# Patient Record
Sex: Male | Born: 1935
Health system: Southern US, Community
[De-identification: ages and names within clinical notes are randomized; demographics above are authoritative.]

## PROBLEM LIST (undated history)

## (undated) DIAGNOSIS — E538 Deficiency of other specified B group vitamins: Secondary | ICD-10-CM

## (undated) DIAGNOSIS — R001 Bradycardia, unspecified: Principal | ICD-10-CM

## (undated) DIAGNOSIS — N400 Enlarged prostate without lower urinary tract symptoms: Secondary | ICD-10-CM

## (undated) DIAGNOSIS — I10 Essential (primary) hypertension: Secondary | ICD-10-CM

## (undated) DIAGNOSIS — E119 Type 2 diabetes mellitus without complications: Secondary | ICD-10-CM

## (undated) DIAGNOSIS — N529 Male erectile dysfunction, unspecified: Secondary | ICD-10-CM

## (undated) HISTORY — DX: Type 2 diabetes mellitus without complications: E11.9

## (undated) HISTORY — PX: HERNIA REPAIR: SHX51

## (undated) HISTORY — DX: Deficiency of other specified B group vitamins: E53.8

## (undated) HISTORY — DX: Male erectile dysfunction, unspecified: N52.9

## (undated) HISTORY — DX: Benign prostatic hyperplasia without lower urinary tract symptoms: N40.0

## (undated) HISTORY — DX: Bradycardia, unspecified: R00.1

## (undated) HISTORY — DX: Essential (primary) hypertension: I10

---

## 2011-07-02 DIAGNOSIS — E785 Hyperlipidemia, unspecified: Secondary | ICD-10-CM | POA: Diagnosis not present

## 2011-07-02 DIAGNOSIS — N400 Enlarged prostate without lower urinary tract symptoms: Secondary | ICD-10-CM | POA: Diagnosis not present

## 2011-07-02 DIAGNOSIS — Z1331 Encounter for screening for depression: Secondary | ICD-10-CM | POA: Diagnosis not present

## 2011-07-02 DIAGNOSIS — I10 Essential (primary) hypertension: Secondary | ICD-10-CM | POA: Diagnosis not present

## 2011-07-02 DIAGNOSIS — R1310 Dysphagia, unspecified: Secondary | ICD-10-CM | POA: Diagnosis not present

## 2011-07-02 DIAGNOSIS — E119 Type 2 diabetes mellitus without complications: Secondary | ICD-10-CM | POA: Diagnosis not present

## 2011-08-28 DIAGNOSIS — D2310 Other benign neoplasm of skin of unspecified eyelid, including canthus: Secondary | ICD-10-CM | POA: Diagnosis not present

## 2011-08-28 DIAGNOSIS — E1139 Type 2 diabetes mellitus with other diabetic ophthalmic complication: Secondary | ICD-10-CM | POA: Diagnosis not present

## 2011-12-31 DIAGNOSIS — K219 Gastro-esophageal reflux disease without esophagitis: Secondary | ICD-10-CM | POA: Diagnosis not present

## 2011-12-31 DIAGNOSIS — W19XXXA Unspecified fall, initial encounter: Secondary | ICD-10-CM | POA: Diagnosis not present

## 2011-12-31 DIAGNOSIS — I10 Essential (primary) hypertension: Secondary | ICD-10-CM | POA: Diagnosis not present

## 2011-12-31 DIAGNOSIS — N529 Male erectile dysfunction, unspecified: Secondary | ICD-10-CM | POA: Diagnosis not present

## 2011-12-31 DIAGNOSIS — E119 Type 2 diabetes mellitus without complications: Secondary | ICD-10-CM | POA: Diagnosis not present

## 2012-03-30 DIAGNOSIS — H3530 Unspecified macular degeneration: Secondary | ICD-10-CM | POA: Diagnosis not present

## 2012-04-09 DIAGNOSIS — H2510 Age-related nuclear cataract, unspecified eye: Secondary | ICD-10-CM | POA: Diagnosis not present

## 2012-04-09 DIAGNOSIS — H5000 Unspecified esotropia: Secondary | ICD-10-CM | POA: Diagnosis not present

## 2012-04-09 DIAGNOSIS — H04129 Dry eye syndrome of unspecified lacrimal gland: Secondary | ICD-10-CM | POA: Diagnosis not present

## 2012-04-10 DIAGNOSIS — Z23 Encounter for immunization: Secondary | ICD-10-CM | POA: Diagnosis not present

## 2012-05-04 DIAGNOSIS — H2510 Age-related nuclear cataract, unspecified eye: Secondary | ICD-10-CM | POA: Diagnosis not present

## 2012-05-11 DIAGNOSIS — H2510 Age-related nuclear cataract, unspecified eye: Secondary | ICD-10-CM | POA: Diagnosis not present

## 2012-05-11 DIAGNOSIS — H2181 Floppy iris syndrome: Secondary | ICD-10-CM | POA: Diagnosis not present

## 2012-05-11 DIAGNOSIS — H269 Unspecified cataract: Secondary | ICD-10-CM | POA: Diagnosis not present

## 2012-06-01 DIAGNOSIS — H25049 Posterior subcapsular polar age-related cataract, unspecified eye: Secondary | ICD-10-CM | POA: Diagnosis not present

## 2012-06-01 DIAGNOSIS — H25019 Cortical age-related cataract, unspecified eye: Secondary | ICD-10-CM | POA: Diagnosis not present

## 2012-06-01 DIAGNOSIS — H2510 Age-related nuclear cataract, unspecified eye: Secondary | ICD-10-CM | POA: Diagnosis not present

## 2012-06-10 DIAGNOSIS — H3340 Traction detachment of retina, unspecified eye: Secondary | ICD-10-CM | POA: Diagnosis not present

## 2012-06-12 DIAGNOSIS — H35369 Drusen (degenerative) of macula, unspecified eye: Secondary | ICD-10-CM | POA: Diagnosis not present

## 2012-06-12 DIAGNOSIS — H43829 Vitreomacular adhesion, unspecified eye: Secondary | ICD-10-CM | POA: Diagnosis not present

## 2012-06-12 DIAGNOSIS — H33109 Unspecified retinoschisis, unspecified eye: Secondary | ICD-10-CM | POA: Diagnosis not present

## 2012-06-12 DIAGNOSIS — E119 Type 2 diabetes mellitus without complications: Secondary | ICD-10-CM | POA: Diagnosis not present

## 2012-07-02 DIAGNOSIS — E119 Type 2 diabetes mellitus without complications: Secondary | ICD-10-CM | POA: Diagnosis not present

## 2012-07-02 DIAGNOSIS — N400 Enlarged prostate without lower urinary tract symptoms: Secondary | ICD-10-CM | POA: Diagnosis not present

## 2012-07-02 DIAGNOSIS — I10 Essential (primary) hypertension: Secondary | ICD-10-CM | POA: Diagnosis not present

## 2012-10-21 DIAGNOSIS — E1139 Type 2 diabetes mellitus with other diabetic ophthalmic complication: Secondary | ICD-10-CM | POA: Diagnosis not present

## 2012-12-15 DIAGNOSIS — H6120 Impacted cerumen, unspecified ear: Secondary | ICD-10-CM | POA: Diagnosis not present

## 2012-12-15 DIAGNOSIS — H60399 Other infective otitis externa, unspecified ear: Secondary | ICD-10-CM | POA: Diagnosis not present

## 2012-12-29 DIAGNOSIS — H60399 Other infective otitis externa, unspecified ear: Secondary | ICD-10-CM | POA: Diagnosis not present

## 2013-01-06 DIAGNOSIS — E119 Type 2 diabetes mellitus without complications: Secondary | ICD-10-CM | POA: Diagnosis not present

## 2013-01-06 DIAGNOSIS — I10 Essential (primary) hypertension: Secondary | ICD-10-CM | POA: Diagnosis not present

## 2013-01-06 DIAGNOSIS — Z1331 Encounter for screening for depression: Secondary | ICD-10-CM | POA: Diagnosis not present

## 2013-01-06 DIAGNOSIS — Z0000 Encounter for general adult medical examination without abnormal findings: Secondary | ICD-10-CM | POA: Diagnosis not present

## 2013-01-06 DIAGNOSIS — E785 Hyperlipidemia, unspecified: Secondary | ICD-10-CM | POA: Diagnosis not present

## 2013-01-06 DIAGNOSIS — N400 Enlarged prostate without lower urinary tract symptoms: Secondary | ICD-10-CM | POA: Diagnosis not present

## 2013-01-26 DIAGNOSIS — H60399 Other infective otitis externa, unspecified ear: Secondary | ICD-10-CM | POA: Diagnosis not present

## 2013-02-08 DIAGNOSIS — M810 Age-related osteoporosis without current pathological fracture: Secondary | ICD-10-CM | POA: Diagnosis not present

## 2013-03-15 DIAGNOSIS — Z23 Encounter for immunization: Secondary | ICD-10-CM | POA: Diagnosis not present

## 2013-07-09 DIAGNOSIS — I10 Essential (primary) hypertension: Secondary | ICD-10-CM | POA: Diagnosis not present

## 2013-07-09 DIAGNOSIS — K222 Esophageal obstruction: Secondary | ICD-10-CM | POA: Diagnosis not present

## 2013-07-09 DIAGNOSIS — M949 Disorder of cartilage, unspecified: Secondary | ICD-10-CM | POA: Diagnosis not present

## 2013-07-09 DIAGNOSIS — M899 Disorder of bone, unspecified: Secondary | ICD-10-CM | POA: Diagnosis not present

## 2013-07-09 DIAGNOSIS — N400 Enlarged prostate without lower urinary tract symptoms: Secondary | ICD-10-CM | POA: Diagnosis not present

## 2013-07-09 DIAGNOSIS — E538 Deficiency of other specified B group vitamins: Secondary | ICD-10-CM | POA: Diagnosis not present

## 2013-07-09 DIAGNOSIS — R413 Other amnesia: Secondary | ICD-10-CM | POA: Diagnosis not present

## 2013-07-09 DIAGNOSIS — E119 Type 2 diabetes mellitus without complications: Secondary | ICD-10-CM | POA: Diagnosis not present

## 2013-07-15 DIAGNOSIS — D510 Vitamin B12 deficiency anemia due to intrinsic factor deficiency: Secondary | ICD-10-CM | POA: Diagnosis not present

## 2013-07-16 DIAGNOSIS — D510 Vitamin B12 deficiency anemia due to intrinsic factor deficiency: Secondary | ICD-10-CM | POA: Diagnosis not present

## 2013-07-19 DIAGNOSIS — D510 Vitamin B12 deficiency anemia due to intrinsic factor deficiency: Secondary | ICD-10-CM | POA: Diagnosis not present

## 2013-07-21 DIAGNOSIS — D510 Vitamin B12 deficiency anemia due to intrinsic factor deficiency: Secondary | ICD-10-CM | POA: Diagnosis not present

## 2013-07-22 DIAGNOSIS — D510 Vitamin B12 deficiency anemia due to intrinsic factor deficiency: Secondary | ICD-10-CM | POA: Diagnosis not present

## 2013-07-30 DIAGNOSIS — D510 Vitamin B12 deficiency anemia due to intrinsic factor deficiency: Secondary | ICD-10-CM | POA: Diagnosis not present

## 2013-08-06 DIAGNOSIS — D510 Vitamin B12 deficiency anemia due to intrinsic factor deficiency: Secondary | ICD-10-CM | POA: Diagnosis not present

## 2013-08-13 DIAGNOSIS — D510 Vitamin B12 deficiency anemia due to intrinsic factor deficiency: Secondary | ICD-10-CM | POA: Diagnosis not present

## 2013-08-20 DIAGNOSIS — D510 Vitamin B12 deficiency anemia due to intrinsic factor deficiency: Secondary | ICD-10-CM | POA: Diagnosis not present

## 2013-09-20 DIAGNOSIS — D510 Vitamin B12 deficiency anemia due to intrinsic factor deficiency: Secondary | ICD-10-CM | POA: Diagnosis not present

## 2013-10-22 DIAGNOSIS — E119 Type 2 diabetes mellitus without complications: Secondary | ICD-10-CM | POA: Diagnosis not present

## 2013-11-29 DIAGNOSIS — D510 Vitamin B12 deficiency anemia due to intrinsic factor deficiency: Secondary | ICD-10-CM | POA: Diagnosis not present

## 2013-12-02 DIAGNOSIS — H3530 Unspecified macular degeneration: Secondary | ICD-10-CM | POA: Diagnosis not present

## 2014-01-19 DIAGNOSIS — D510 Vitamin B12 deficiency anemia due to intrinsic factor deficiency: Secondary | ICD-10-CM | POA: Diagnosis not present

## 2014-01-19 DIAGNOSIS — K222 Esophageal obstruction: Secondary | ICD-10-CM | POA: Diagnosis not present

## 2014-01-19 DIAGNOSIS — N400 Enlarged prostate without lower urinary tract symptoms: Secondary | ICD-10-CM | POA: Diagnosis not present

## 2014-01-19 DIAGNOSIS — E538 Deficiency of other specified B group vitamins: Secondary | ICD-10-CM | POA: Diagnosis not present

## 2014-01-19 DIAGNOSIS — E785 Hyperlipidemia, unspecified: Secondary | ICD-10-CM | POA: Diagnosis not present

## 2014-01-19 DIAGNOSIS — I10 Essential (primary) hypertension: Secondary | ICD-10-CM | POA: Diagnosis not present

## 2014-01-19 DIAGNOSIS — E119 Type 2 diabetes mellitus without complications: Secondary | ICD-10-CM | POA: Diagnosis not present

## 2014-01-19 DIAGNOSIS — Z23 Encounter for immunization: Secondary | ICD-10-CM | POA: Diagnosis not present

## 2014-01-19 DIAGNOSIS — R413 Other amnesia: Secondary | ICD-10-CM | POA: Diagnosis not present

## 2014-01-19 DIAGNOSIS — Z1331 Encounter for screening for depression: Secondary | ICD-10-CM | POA: Diagnosis not present

## 2014-04-27 DIAGNOSIS — Z23 Encounter for immunization: Secondary | ICD-10-CM | POA: Diagnosis not present

## 2014-07-26 DIAGNOSIS — E538 Deficiency of other specified B group vitamins: Secondary | ICD-10-CM | POA: Diagnosis not present

## 2014-07-26 DIAGNOSIS — N400 Enlarged prostate without lower urinary tract symptoms: Secondary | ICD-10-CM | POA: Diagnosis not present

## 2014-07-26 DIAGNOSIS — G3184 Mild cognitive impairment, so stated: Secondary | ICD-10-CM | POA: Diagnosis not present

## 2014-07-26 DIAGNOSIS — I10 Essential (primary) hypertension: Secondary | ICD-10-CM | POA: Diagnosis not present

## 2014-07-26 DIAGNOSIS — E119 Type 2 diabetes mellitus without complications: Secondary | ICD-10-CM | POA: Diagnosis not present

## 2015-01-24 DIAGNOSIS — H3531 Nonexudative age-related macular degeneration: Secondary | ICD-10-CM | POA: Diagnosis not present

## 2015-01-25 DIAGNOSIS — Z0000 Encounter for general adult medical examination without abnormal findings: Secondary | ICD-10-CM | POA: Diagnosis not present

## 2015-01-25 DIAGNOSIS — E538 Deficiency of other specified B group vitamins: Secondary | ICD-10-CM | POA: Diagnosis not present

## 2015-01-25 DIAGNOSIS — Z23 Encounter for immunization: Secondary | ICD-10-CM | POA: Diagnosis not present

## 2015-01-25 DIAGNOSIS — N400 Enlarged prostate without lower urinary tract symptoms: Secondary | ICD-10-CM | POA: Diagnosis not present

## 2015-01-25 DIAGNOSIS — E119 Type 2 diabetes mellitus without complications: Secondary | ICD-10-CM | POA: Diagnosis not present

## 2015-01-25 DIAGNOSIS — E780 Pure hypercholesterolemia: Secondary | ICD-10-CM | POA: Diagnosis not present

## 2015-01-25 DIAGNOSIS — Z1389 Encounter for screening for other disorder: Secondary | ICD-10-CM | POA: Diagnosis not present

## 2015-01-25 DIAGNOSIS — I10 Essential (primary) hypertension: Secondary | ICD-10-CM | POA: Diagnosis not present

## 2015-01-25 DIAGNOSIS — K222 Esophageal obstruction: Secondary | ICD-10-CM | POA: Diagnosis not present

## 2015-01-25 DIAGNOSIS — G3184 Mild cognitive impairment, so stated: Secondary | ICD-10-CM | POA: Diagnosis not present

## 2015-07-28 ENCOUNTER — Other Ambulatory Visit: Payer: Self-pay | Admitting: Internal Medicine

## 2015-07-28 DIAGNOSIS — E538 Deficiency of other specified B group vitamins: Secondary | ICD-10-CM | POA: Diagnosis not present

## 2015-07-28 DIAGNOSIS — I10 Essential (primary) hypertension: Secondary | ICD-10-CM | POA: Diagnosis not present

## 2015-07-28 DIAGNOSIS — G3184 Mild cognitive impairment, so stated: Secondary | ICD-10-CM | POA: Diagnosis not present

## 2015-07-28 DIAGNOSIS — E119 Type 2 diabetes mellitus without complications: Secondary | ICD-10-CM | POA: Diagnosis not present

## 2015-07-28 DIAGNOSIS — R413 Other amnesia: Secondary | ICD-10-CM

## 2015-07-28 DIAGNOSIS — E7800 Pure hypercholesterolemia, unspecified: Secondary | ICD-10-CM | POA: Diagnosis not present

## 2015-07-28 DIAGNOSIS — Z7984 Long term (current) use of oral hypoglycemic drugs: Secondary | ICD-10-CM | POA: Diagnosis not present

## 2015-08-04 DIAGNOSIS — E538 Deficiency of other specified B group vitamins: Secondary | ICD-10-CM | POA: Diagnosis not present

## 2015-08-04 DIAGNOSIS — I10 Essential (primary) hypertension: Secondary | ICD-10-CM | POA: Diagnosis not present

## 2015-08-07 ENCOUNTER — Ambulatory Visit
Admission: RE | Admit: 2015-08-07 | Discharge: 2015-08-07 | Disposition: A | Discharge status: Home / Self Care | Payer: Medicare Other | Source: Ambulatory Visit | Attending: Internal Medicine | Admitting: Internal Medicine

## 2015-08-07 DIAGNOSIS — R413 Other amnesia: Secondary | ICD-10-CM | POA: Diagnosis not present

## 2015-08-07 MED ORDER — IOPAMIDOL (ISOVUE-300) INJECTION 61%
75.0000 mL | Freq: Once | INTRAVENOUS | Status: AC | PRN
  Administered 2015-08-07: 75 mL via INTRAVENOUS

## 2015-08-15 DIAGNOSIS — L570 Actinic keratosis: Secondary | ICD-10-CM | POA: Diagnosis not present

## 2015-08-15 DIAGNOSIS — L821 Other seborrheic keratosis: Secondary | ICD-10-CM | POA: Diagnosis not present

## 2015-08-15 DIAGNOSIS — C44319 Basal cell carcinoma of skin of other parts of face: Secondary | ICD-10-CM | POA: Diagnosis not present

## 2015-08-15 DIAGNOSIS — Z85828 Personal history of other malignant neoplasm of skin: Secondary | ICD-10-CM | POA: Diagnosis not present

## 2015-08-15 DIAGNOSIS — D485 Neoplasm of uncertain behavior of skin: Secondary | ICD-10-CM | POA: Diagnosis not present

## 2015-08-15 DIAGNOSIS — C44311 Basal cell carcinoma of skin of nose: Secondary | ICD-10-CM | POA: Diagnosis not present

## 2015-09-05 DIAGNOSIS — E538 Deficiency of other specified B group vitamins: Secondary | ICD-10-CM | POA: Diagnosis not present

## 2015-09-13 DIAGNOSIS — C44319 Basal cell carcinoma of skin of other parts of face: Secondary | ICD-10-CM | POA: Diagnosis not present

## 2015-09-13 DIAGNOSIS — Z85828 Personal history of other malignant neoplasm of skin: Secondary | ICD-10-CM | POA: Diagnosis not present

## 2015-09-21 DIAGNOSIS — Z85828 Personal history of other malignant neoplasm of skin: Secondary | ICD-10-CM | POA: Diagnosis not present

## 2015-09-21 DIAGNOSIS — C44311 Basal cell carcinoma of skin of nose: Secondary | ICD-10-CM | POA: Diagnosis not present

## 2015-12-21 DIAGNOSIS — Z85828 Personal history of other malignant neoplasm of skin: Secondary | ICD-10-CM | POA: Diagnosis not present

## 2015-12-21 DIAGNOSIS — L812 Freckles: Secondary | ICD-10-CM | POA: Diagnosis not present

## 2015-12-21 DIAGNOSIS — L821 Other seborrheic keratosis: Secondary | ICD-10-CM | POA: Diagnosis not present

## 2015-12-21 DIAGNOSIS — L570 Actinic keratosis: Secondary | ICD-10-CM | POA: Diagnosis not present

## 2015-12-21 DIAGNOSIS — D1801 Hemangioma of skin and subcutaneous tissue: Secondary | ICD-10-CM | POA: Diagnosis not present

## 2016-01-29 DIAGNOSIS — Z1389 Encounter for screening for other disorder: Secondary | ICD-10-CM | POA: Diagnosis not present

## 2016-01-29 DIAGNOSIS — Z7984 Long term (current) use of oral hypoglycemic drugs: Secondary | ICD-10-CM | POA: Diagnosis not present

## 2016-01-29 DIAGNOSIS — E119 Type 2 diabetes mellitus without complications: Secondary | ICD-10-CM | POA: Diagnosis not present

## 2016-01-29 DIAGNOSIS — G3184 Mild cognitive impairment, so stated: Secondary | ICD-10-CM | POA: Diagnosis not present

## 2016-01-29 DIAGNOSIS — I10 Essential (primary) hypertension: Secondary | ICD-10-CM | POA: Diagnosis not present

## 2016-01-29 DIAGNOSIS — N400 Enlarged prostate without lower urinary tract symptoms: Secondary | ICD-10-CM | POA: Diagnosis not present

## 2016-01-29 DIAGNOSIS — E7800 Pure hypercholesterolemia, unspecified: Secondary | ICD-10-CM | POA: Diagnosis not present

## 2016-04-02 DIAGNOSIS — Z23 Encounter for immunization: Secondary | ICD-10-CM | POA: Diagnosis not present

## 2016-04-08 DIAGNOSIS — H6121 Impacted cerumen, right ear: Secondary | ICD-10-CM | POA: Diagnosis not present

## 2016-05-04 DIAGNOSIS — S61412A Laceration without foreign body of left hand, initial encounter: Secondary | ICD-10-CM | POA: Diagnosis not present

## 2016-06-25 DIAGNOSIS — Z85828 Personal history of other malignant neoplasm of skin: Secondary | ICD-10-CM | POA: Diagnosis not present

## 2016-06-25 DIAGNOSIS — L570 Actinic keratosis: Secondary | ICD-10-CM | POA: Diagnosis not present

## 2016-06-25 DIAGNOSIS — L853 Xerosis cutis: Secondary | ICD-10-CM | POA: Diagnosis not present

## 2016-08-06 DIAGNOSIS — E119 Type 2 diabetes mellitus without complications: Secondary | ICD-10-CM | POA: Diagnosis not present

## 2016-08-06 DIAGNOSIS — K219 Gastro-esophageal reflux disease without esophagitis: Secondary | ICD-10-CM | POA: Diagnosis not present

## 2016-08-06 DIAGNOSIS — I10 Essential (primary) hypertension: Secondary | ICD-10-CM | POA: Diagnosis not present

## 2016-08-06 DIAGNOSIS — N400 Enlarged prostate without lower urinary tract symptoms: Secondary | ICD-10-CM | POA: Diagnosis not present

## 2016-08-06 DIAGNOSIS — Z7984 Long term (current) use of oral hypoglycemic drugs: Secondary | ICD-10-CM | POA: Diagnosis not present

## 2016-08-13 DIAGNOSIS — K222 Esophageal obstruction: Secondary | ICD-10-CM | POA: Diagnosis not present

## 2016-09-17 DIAGNOSIS — K210 Gastro-esophageal reflux disease with esophagitis: Secondary | ICD-10-CM | POA: Diagnosis not present

## 2016-09-17 DIAGNOSIS — T182XXA Foreign body in stomach, initial encounter: Secondary | ICD-10-CM | POA: Diagnosis not present

## 2016-09-17 DIAGNOSIS — R1310 Dysphagia, unspecified: Secondary | ICD-10-CM | POA: Diagnosis not present

## 2016-09-17 DIAGNOSIS — K222 Esophageal obstruction: Secondary | ICD-10-CM | POA: Diagnosis not present

## 2017-01-03 IMAGING — CT CT HEAD WO/W CM
1 of 2 series · 13 of 30 positions shown, 17 images · IV contrast (75CC ISOVUE 300)
Comparison: None.

CLINICAL DATA: Memory loss over the last year.  Blurred vision.

EXAM:
CT HEAD WITHOUT AND WITH CONTRAST
TECHNIQUE: Contiguous axial images were obtained from the base of the skull
through the vertex without and with intravenous contrast
CONTRAST:  75mL VESD9T-F44 IOPAMIDOL (VESD9T-F44) INJECTION 61%

[Series 32: 3d filtered head w/o · axial · non-contrast · 0.49mm/px · z∈[+18,+150]mm · 13 of 32 slices shown, 17 images]
[im 3/32  brain]
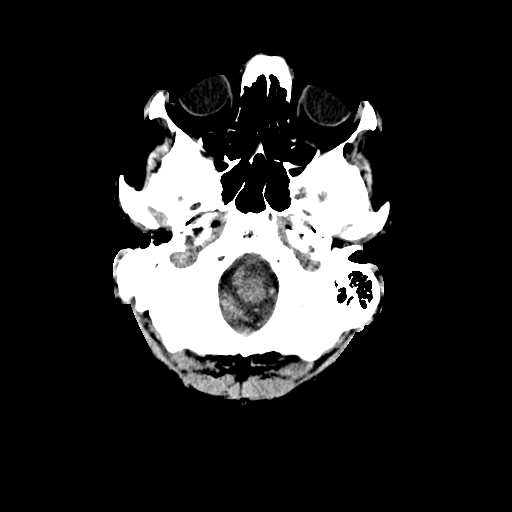
[im 3/32  bone]
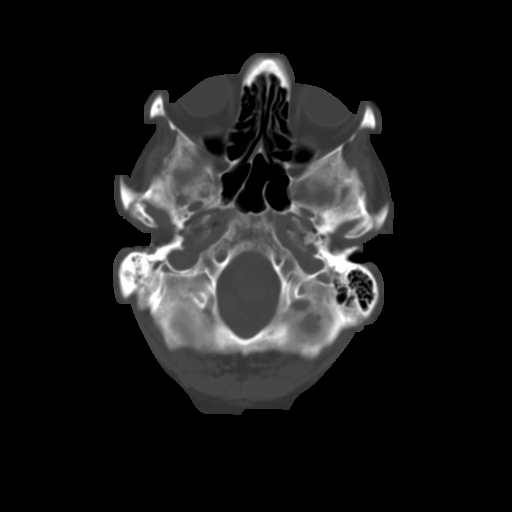
[im 5/32  brain]
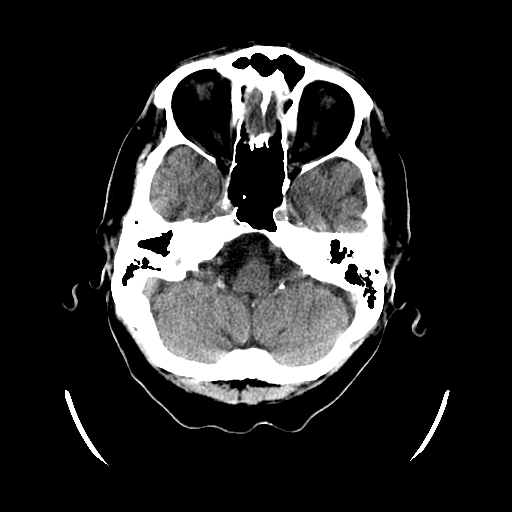
[im 7/32  brain]
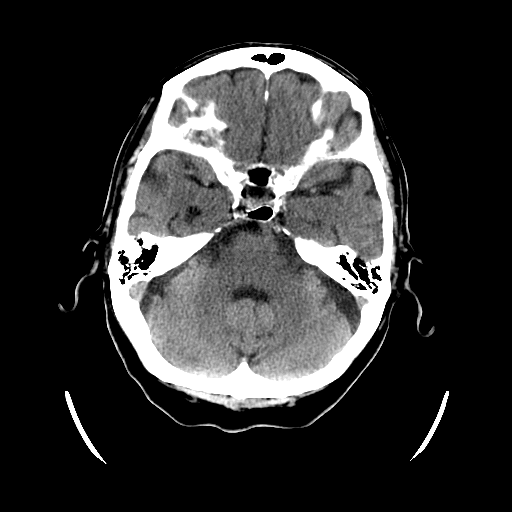
[im 9/32  brain]
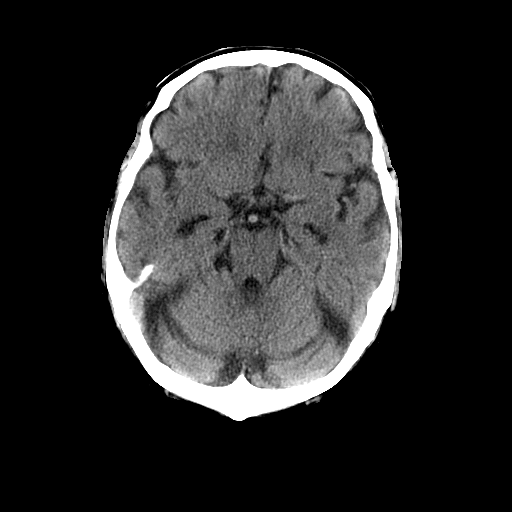
[im 12/32  brain]
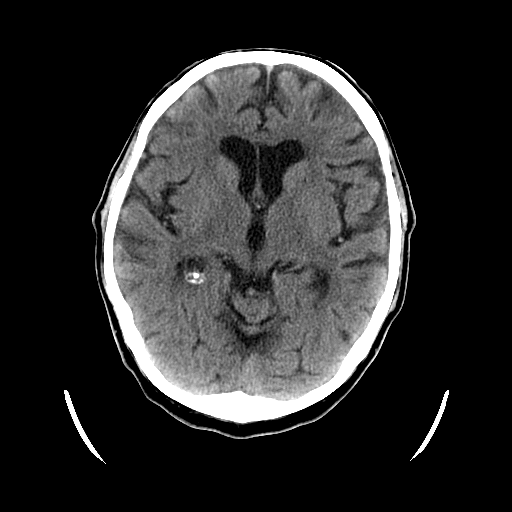
[im 12/32  bone]
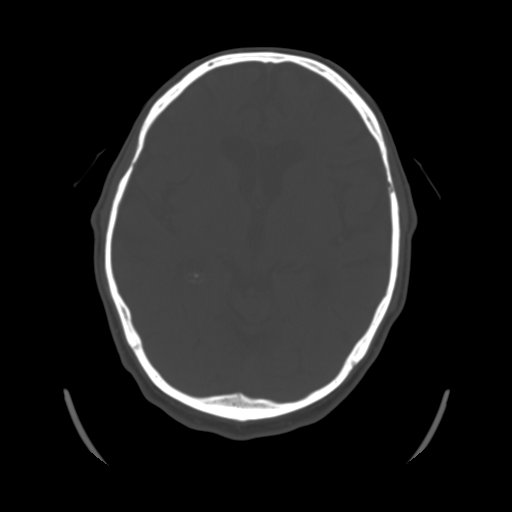
[im 14/32  brain]
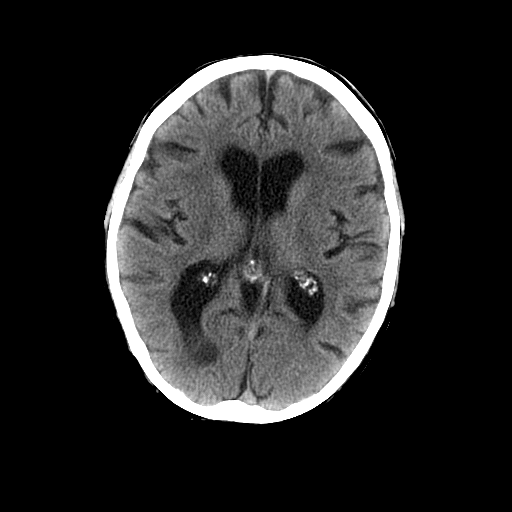
[im 16/32  brain]
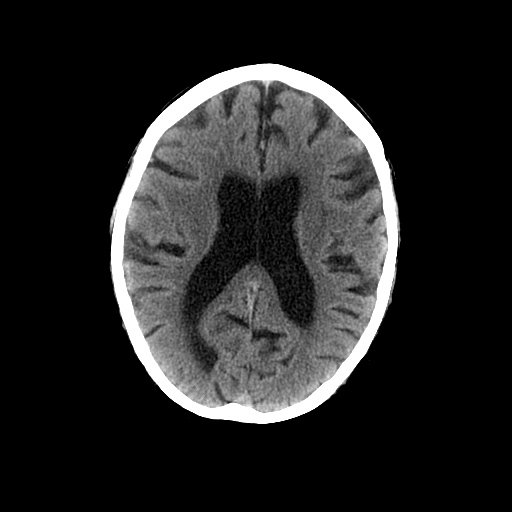
[im 18/32  brain]
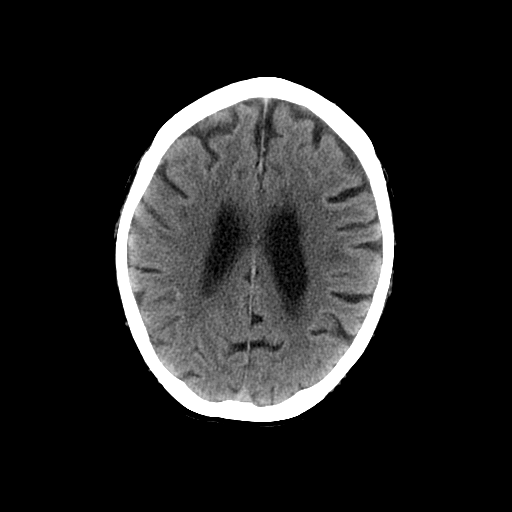
[im 20/32  brain]
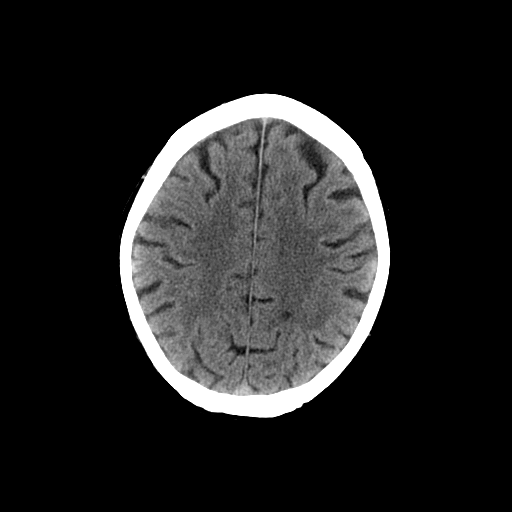
[im 20/32  bone]
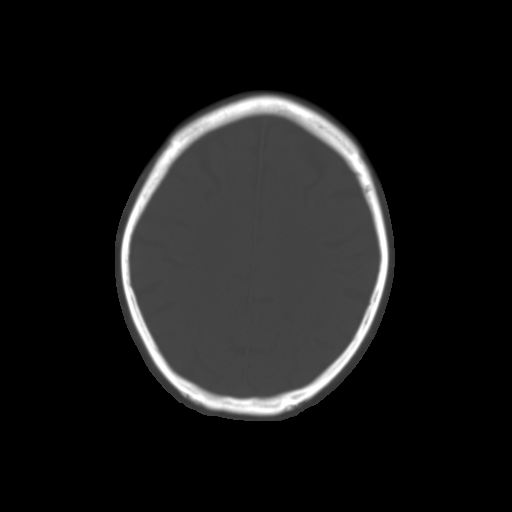
[im 23/32  brain]
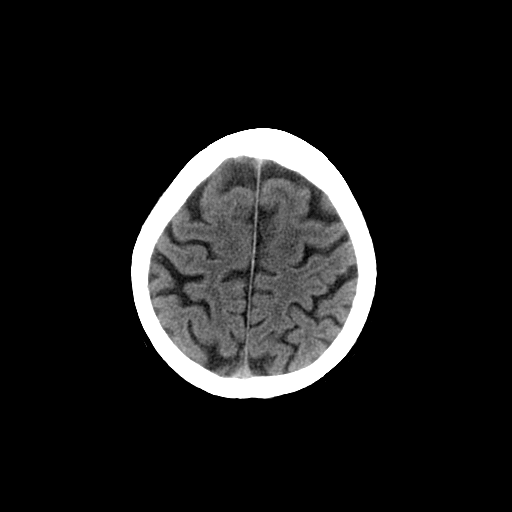
[im 25/32  brain]
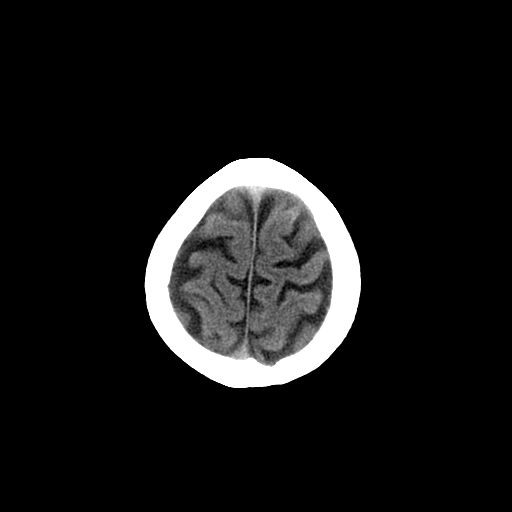
[im 27/32  brain]
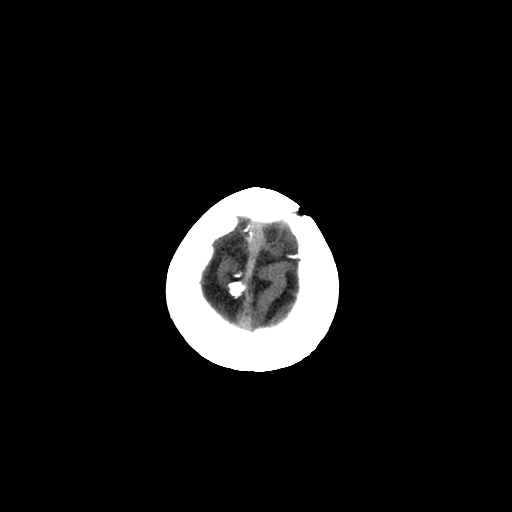
[im 29/32  brain]
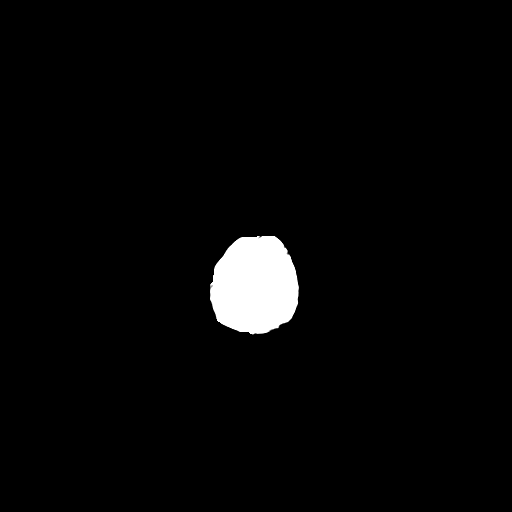
[im 29/32  bone]
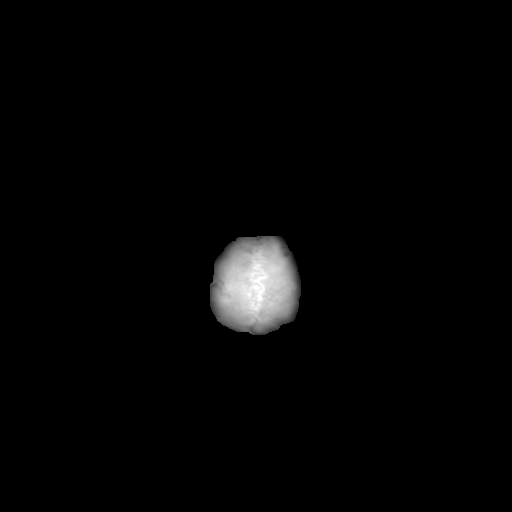

[13 of 30 positions shown; findings below may reference images not displayed]

FINDINGS: Mild atrophy and white matter changes are within normal limits for
age. The ventricles are proportionate to the degree of atrophy.
Insert pass fluid

No acute infarct, hemorrhage, or mass lesion is present. Mild
atrophy is within normal limits for age and nonspecific.

The paranasal sinuses and mastoid air cells are clear. The globes
and orbits are intact. The calvarium is within normal limits.
Atherosclerotic calcifications are present within the cavernous
internal carotid arteries.
IMPRESSION: 1. Mild atherosclerotic changes.
2. Normal CT appearance of the brain for age.
3. No pathologic enhancement.

## 2017-02-05 DIAGNOSIS — G3184 Mild cognitive impairment, so stated: Secondary | ICD-10-CM | POA: Diagnosis not present

## 2017-02-05 DIAGNOSIS — Z0000 Encounter for general adult medical examination without abnormal findings: Secondary | ICD-10-CM | POA: Diagnosis not present

## 2017-02-05 DIAGNOSIS — Z1389 Encounter for screening for other disorder: Secondary | ICD-10-CM | POA: Diagnosis not present

## 2017-02-05 DIAGNOSIS — N400 Enlarged prostate without lower urinary tract symptoms: Secondary | ICD-10-CM | POA: Diagnosis not present

## 2017-02-05 DIAGNOSIS — K219 Gastro-esophageal reflux disease without esophagitis: Secondary | ICD-10-CM | POA: Diagnosis not present

## 2017-02-05 DIAGNOSIS — E7800 Pure hypercholesterolemia, unspecified: Secondary | ICD-10-CM | POA: Diagnosis not present

## 2017-02-05 DIAGNOSIS — E119 Type 2 diabetes mellitus without complications: Secondary | ICD-10-CM | POA: Diagnosis not present

## 2017-02-05 DIAGNOSIS — I10 Essential (primary) hypertension: Secondary | ICD-10-CM | POA: Diagnosis not present

## 2017-03-12 DIAGNOSIS — Z23 Encounter for immunization: Secondary | ICD-10-CM | POA: Diagnosis not present

## 2017-04-23 DIAGNOSIS — E119 Type 2 diabetes mellitus without complications: Secondary | ICD-10-CM | POA: Diagnosis not present

## 2017-08-19 DIAGNOSIS — E119 Type 2 diabetes mellitus without complications: Secondary | ICD-10-CM | POA: Diagnosis not present

## 2017-08-19 DIAGNOSIS — Z7984 Long term (current) use of oral hypoglycemic drugs: Secondary | ICD-10-CM | POA: Diagnosis not present

## 2017-08-19 DIAGNOSIS — I10 Essential (primary) hypertension: Secondary | ICD-10-CM | POA: Diagnosis not present

## 2017-08-19 DIAGNOSIS — K219 Gastro-esophageal reflux disease without esophagitis: Secondary | ICD-10-CM | POA: Diagnosis not present

## 2017-08-19 DIAGNOSIS — N400 Enlarged prostate without lower urinary tract symptoms: Secondary | ICD-10-CM | POA: Diagnosis not present

## 2018-02-11 DIAGNOSIS — G3184 Mild cognitive impairment, so stated: Secondary | ICD-10-CM | POA: Diagnosis not present

## 2018-02-11 DIAGNOSIS — K222 Esophageal obstruction: Secondary | ICD-10-CM | POA: Diagnosis not present

## 2018-02-11 DIAGNOSIS — I10 Essential (primary) hypertension: Secondary | ICD-10-CM | POA: Diagnosis not present

## 2018-02-11 DIAGNOSIS — R001 Bradycardia, unspecified: Secondary | ICD-10-CM | POA: Diagnosis not present

## 2018-02-11 DIAGNOSIS — Z23 Encounter for immunization: Secondary | ICD-10-CM | POA: Diagnosis not present

## 2018-02-11 DIAGNOSIS — Z0000 Encounter for general adult medical examination without abnormal findings: Secondary | ICD-10-CM | POA: Diagnosis not present

## 2018-02-11 DIAGNOSIS — Z1389 Encounter for screening for other disorder: Secondary | ICD-10-CM | POA: Diagnosis not present

## 2018-02-11 DIAGNOSIS — E7800 Pure hypercholesterolemia, unspecified: Secondary | ICD-10-CM | POA: Diagnosis not present

## 2018-02-11 DIAGNOSIS — E1169 Type 2 diabetes mellitus with other specified complication: Secondary | ICD-10-CM | POA: Diagnosis not present

## 2018-02-11 DIAGNOSIS — N400 Enlarged prostate without lower urinary tract symptoms: Secondary | ICD-10-CM | POA: Diagnosis not present

## 2018-03-09 ENCOUNTER — Ambulatory Visit (INDEPENDENT_AMBULATORY_CARE_PROVIDER_SITE_OTHER): Payer: Medicare Other | Admitting: Cardiology

## 2018-03-09 ENCOUNTER — Encounter: Payer: Self-pay | Admitting: Cardiology

## 2018-03-09 VITALS — BP 108/62 | HR 68 | Ht 66.0 in | Wt 141.0 lb

## 2018-03-09 DIAGNOSIS — I10 Essential (primary) hypertension: Secondary | ICD-10-CM

## 2018-03-09 DIAGNOSIS — E119 Type 2 diabetes mellitus without complications: Secondary | ICD-10-CM | POA: Insufficient documentation

## 2018-03-09 DIAGNOSIS — N401 Enlarged prostate with lower urinary tract symptoms: Secondary | ICD-10-CM

## 2018-03-09 DIAGNOSIS — N400 Enlarged prostate without lower urinary tract symptoms: Secondary | ICD-10-CM | POA: Insufficient documentation

## 2018-03-09 DIAGNOSIS — E7800 Pure hypercholesterolemia, unspecified: Secondary | ICD-10-CM

## 2018-03-09 DIAGNOSIS — E1169 Type 2 diabetes mellitus with other specified complication: Secondary | ICD-10-CM | POA: Diagnosis not present

## 2018-03-09 DIAGNOSIS — Z794 Long term (current) use of insulin: Secondary | ICD-10-CM

## 2018-03-09 DIAGNOSIS — R001 Bradycardia, unspecified: Secondary | ICD-10-CM | POA: Diagnosis not present

## 2018-03-09 HISTORY — DX: Bradycardia, unspecified: R00.1

## 2018-03-09 NOTE — Patient Instructions (Signed)
Medication Instructions:  Your physician recommends that you continue on your current medications as directed. Please refer to the Current Medication list given to you today.  If you need a refill on your cardiac medications before your next appointment, please call your pharmacy.   Lab work: None  If you have labs (blood work) drawn today and your tests are completely normal, you will receive your results only by: Marland Kitchen MyChart Message (if you have MyChart) OR . A paper copy in the mail If you have any lab test that is abnormal or we need to change your treatment, we will call you to review the results.  Testing/Procedures: Your physician has requested that you have an echocardiogram. Echocardiography is a painless test that uses sound waves to create images of your heart. It provides your doctor with information about the size and shape of your heart and how well your heart's chambers and valves are working. This procedure takes approximately one hour. There are no restrictions for this procedure.  Your physician has recommended that you wear a holter monitor. Holter monitors are medical devices that record the heart's electrical activity. Doctors most often use these monitors to diagnose arrhythmias. Arrhythmias are problems with the speed or rhythm of the heartbeat. The monitor is a small, portable device. You can wear one while you do your normal daily activities. This is usually used to diagnose what is causing palpitations/syncope (passing out).   Follow-Up: At [facility], you and your health needs are our priority.  As part of our continuing mission to provide you with exceptional heart care, we have created designated Provider Care Teams.  These Care Teams include your primary Cardiologist (physician) and Advanced Practice Providers (APPs -  Physician Assistants and Nurse Practitioners) who all work together to provide you with the care you need, when you need it.  You will need a  follow up appointment in 6 months.  Please call our office 2 months in advance to schedule this appointment.  You may see another member of our [employer] Provider Team in [place]: Jenne Campus, MD . Shirlee More, MD  Any Other Special Instructions Will Be Listed Below (If Applicable).     [place], RN, BSN

## 2018-03-09 NOTE — Progress Notes (Signed)
Cardiology Office Note:    Date:  03/09/2018   ID:  Jeffrey Howe, DOB [birth year], MRN [id number]  PCP:  Lavone Orn, MD  Cardiologist:  Jenean Lindau, MD   Referring MD: Lavone Orn, MD    ASSESSMENT:    1. Bradycardia   2. Type 2 diabetes mellitus with other specified complication, with long-term current use of insulin ([place])   3. Essential hypertension   4. Benign prostatic hyperplasia with lower urinary tract symptoms, symptom details unspecified   5. Hypercholesteremia    PLAN:    In order of problems listed above:  1. I discussed my findings with the patient at extensive length.  The EKG that was sent to US reveals sinus rhythm with first-degree AV block.  Patient is completely asymptomatic and active.  To assess this further and to see if he has any significant bradycardia arrhythmias will do a 48-hour Holter monitor.  The patient's TSH is fine.  Echocardiogram will be done to assess murmur heard on auscultation. 2. Patient will be seen in follow-up appointment in 6 months or earlier if the patient has any concerns.  Lipids, blood pressure issues and diabetes is managed by primary care physician.    Medication Adjustments/Labs and Tests Ordered: Current medicines are reviewed at length with the patient today.  Concerns regarding medicines are outlined above.  Orders Placed This Encounter  Procedures  . HOLTER MONITOR - 48 HOUR  . ECHOCARDIOGRAM COMPLETE   No orders of the defined types were placed in this encounter.    History of Present Illness:    Jeffrey Howe is a 84 y.o. male who is being seen today for the evaluation of bradycardia and possible cardiac arrhythmia at the request of Lavone Orn, MD.  Patient is a pleasant 84 year old male.  He looks healthy for his age.  He is accompanied by his son.  He has a history of essential hypertension and diabetes mellitus.  The patient has been referred here for an abnormal EKG and the possibility of bradycardia  arrhythmias.  There was a concern whether his heart rate is abnormal.  He denies any chest pain orthopnea or PND.  He ambulates age appropriately.  No dizziness or any syncope.  At the time of my evaluation, the patient is alert awake oriented and in no distress.  Past Medical History:  Diagnosis Date  . BPH (benign prostatic hyperplasia)   . Bradycardia 03/09/2018  . Diabetes ([place])   . ED (erectile dysfunction)   . Hypertension   . Vitamin B 12 deficiency     Past Surgical History:  Procedure Laterality Date  . HERNIA REPAIR      Current Medications: Current Meds  Medication Sig  . celecoxib (CELEBREX) 200 MG capsule Take 200 mg by mouth 2 (two) times daily.  Marland Kitchen glipiZIDE (GLUCOTROL XL) 2.5 MG 24 hr tablet Take 2.5 mg by mouth daily with breakfast.  . lisinopril (PRINIVIL,ZESTRIL) 20 MG tablet Take 1 tablet by mouth daily.  . metFORMIN (GLUCOPHAGE) 500 MG tablet Take 1 tablet by mouth 2 (two) times daily with a meal.  . omeprazole (PRILOSEC) 40 MG capsule Take 40 mg by mouth daily.  . simvastatin (ZOCOR) 40 MG tablet Take 40 mg by mouth daily.  . tamsulosin (FLOMAX) 0.4 MG CAPS capsule Take 0.4 mg by mouth daily.     Allergies:   Patient has no known allergies.   Social History   Socioeconomic History  . Marital status: Married    Spouse name:  Not on file  . Number of children: Not on file  . Years of education: Not on file  . Highest education level: Not on file  Occupational History  . Not on file  Social Needs  . Financial resource strain: Not on file  . Food insecurity:    Worry: Not on file    Inability: Not on file  . Transportation needs:    Medical: Not on file    Non-medical: Not on file  Tobacco Use  . Smoking status: Former Smoker    Types: Pipe, Landscape architect  . Smokeless tobacco: Never Used  Substance and Sexual Activity  . Alcohol use: Not on file  . Drug use: Not on file  . Sexual activity: Not on file  Lifestyle  . Physical activity:    Days per  week: Not on file    Minutes per session: Not on file  . Stress: Not on file  Relationships  . Social connections:    Talks on phone: Not on file    Gets together: Not on file    Attends religious service: Not on file    Active member of club or organization: Not on file    Attends meetings of clubs or organizations: Not on file    Relationship status: Not on file  Other Topics Concern  . Not on file  Social History Narrative  . Not on file     Family History: The patient's family history is not on file.  ROS:   Please see the history of present illness.    All other systems reviewed and are negative.  EKGs/Labs/Other Studies Reviewed:    The following studies were reviewed today: EKG reveals sinus rhythm with bradycardia with first-degree AV block.   Recent Labs: No results found for requested labs within last 8760 hours.  Recent Lipid Panel No results found for: CHOL, TRIG, HDL, CHOLHDL, VLDL, LDLCALC, LDLDIRECT  Physical Exam:    VS:  BP 108/62 (BP Location: Right Arm, Patient Position: Sitting, Cuff Size: Normal)   Pulse 68   Ht 5\' 6"  (1.676 m)   Wt 141 lb (64 kg)   SpO2 98%   BMI 22.76 kg/m     Wt Readings from Last 3 Encounters:  03/09/18 141 lb (64 kg)     GEN: Patient is in no acute distress HEENT: Normal NECK: No JVD; No carotid bruits LYMPHATICS: No lymphadenopathy CARDIAC: S1 S2 regular, 2/6 systolic murmur at the apex. RESPIRATORY:  Clear to auscultation without rales, wheezing or rhonchi  ABDOMEN: Soft, non-tender, non-distended MUSCULOSKELETAL:  No edema; No deformity  SKIN: Warm and dry NEUROLOGIC:  Alert and oriented x 3 PSYCHIATRIC:  Normal affect    Signed, Jenean Lindau, MD  03/09/2018 12:16 PM    [facility]

## 2018-03-30 ENCOUNTER — Ambulatory Visit (INDEPENDENT_AMBULATORY_CARE_PROVIDER_SITE_OTHER): Payer: Medicare Other

## 2018-03-30 DIAGNOSIS — R001 Bradycardia, unspecified: Secondary | ICD-10-CM

## 2018-04-06 ENCOUNTER — Ambulatory Visit: Payer: Medicare Other | Admitting: Cardiology

## 2018-04-08 ENCOUNTER — Telehealth: Payer: Self-pay

## 2018-04-08 ENCOUNTER — Other Ambulatory Visit: Payer: Self-pay

## 2018-04-08 DIAGNOSIS — R9431 Abnormal electrocardiogram [ECG] [EKG]: Secondary | ICD-10-CM

## 2018-04-08 NOTE — Telephone Encounter (Signed)
Informed patient of holter results; informed to call the office with any further questions or concerns. Patient is agreeable to be seen asap.

## 2018-04-13 ENCOUNTER — Institutional Professional Consult (permissible substitution): Payer: Medicare Other | Admitting: Cardiology

## 2018-04-13 ENCOUNTER — Encounter: Payer: Self-pay | Admitting: Cardiology

## 2018-04-13 ENCOUNTER — Ambulatory Visit (INDEPENDENT_AMBULATORY_CARE_PROVIDER_SITE_OTHER): Payer: Medicare Other | Admitting: Cardiology

## 2018-04-13 VITALS — BP 116/70 | HR 77 | Ht 66.0 in | Wt 135.0 lb

## 2018-04-13 DIAGNOSIS — I10 Essential (primary) hypertension: Secondary | ICD-10-CM | POA: Diagnosis not present

## 2018-04-13 DIAGNOSIS — I441 Atrioventricular block, second degree: Secondary | ICD-10-CM | POA: Diagnosis not present

## 2018-04-13 DIAGNOSIS — E785 Hyperlipidemia, unspecified: Secondary | ICD-10-CM | POA: Diagnosis not present

## 2018-04-13 NOTE — Patient Instructions (Signed)
Medication Instructions:    Your physician recommends that you continue on your current medications as directed. Please refer to the Current Medication list given to you today.  - If you need a refill on your cardiac medications before your next appointment, please call your pharmacy.   Labwork:  None ordered  Testing/Procedures:  None ordered  Follow-Up:  Your physician recommends that you schedule a follow-up appointment in: 3 months with Dr. Camnitz.  Thank you for choosing [facility]!!   Anasofia Micallef, RN [phone]         

## 2018-04-13 NOTE — Progress Notes (Deleted)
Electrophysiology Office Note   Date:  04/13/2018   ID:  Jeffrey Howe, DOB 04-10-1930, MRN [id number]  PCP:  Jeffrey Orn, MD  Cardiologist:  Howe Primary Electrophysiologist:  Jeffrey Plant Meredith Leeds, MD    No chief complaint on file.    History of Present Illness: Jeffrey Howe is a 84 y.o. male who is being seen today for the evaluation of bradycardia at the request of Jeffrey Howe. Presenting today for electrophysiology evaluation.  He has a history of bradycardia, diabetes, hypertension.  He was initially referred for an abnormal EKG showing bradycardia.  He has no chest pain, orthopnea, or PND.    Today, he denies*** symptoms of palpitations, chest pain, shortness of breath, orthopnea, PND, lower extremity edema, claudication, dizziness, presyncope, syncope, bleeding, or neurologic sequela. The patient is tolerating medications without difficulties.    Past Medical History:  Diagnosis Date  . BPH (benign prostatic hyperplasia)   . Bradycardia 03/09/2018  . Diabetes ([place])   . ED (erectile dysfunction)   . Hypertension   . Vitamin B 12 deficiency    Past Surgical History:  Procedure Laterality Date  . HERNIA REPAIR       Current Outpatient Medications  Medication Sig Dispense Refill  . celecoxib (CELEBREX) 200 MG capsule Take 200 mg by mouth 2 (two) times daily.    Marland Kitchen glipiZIDE (GLUCOTROL XL) 2.5 MG 24 hr tablet Take 2.5 mg by mouth daily with breakfast.    . lisinopril (PRINIVIL,ZESTRIL) 20 MG tablet Take 1 tablet by mouth daily.    . metFORMIN (GLUCOPHAGE) 500 MG tablet Take 1 tablet by mouth 2 (two) times daily with a meal.    . omeprazole (PRILOSEC) 40 MG capsule Take 40 mg by mouth daily.    . simvastatin (ZOCOR) 40 MG tablet Take 40 mg by mouth daily.    . tamsulosin (FLOMAX) 0.4 MG CAPS capsule Take 0.4 mg by mouth daily.     No current facility-administered medications for this visit.     Allergies:   Patient has no known allergies.   Social History:   The patient  reports that he has quit smoking. His smoking use included pipe and cigars. He has never used smokeless tobacco.   Family History:  The patient's ***family history is not on file.    ROS:  Please see the history of present illness.   Otherwise, review of systems is positive for ***.   All other systems are reviewed and negative.    PHYSICAL EXAM: VS:  There were no vitals taken for this visit. , BMI There is no height or weight on file to calculate BMI. GEN: Well nourished, well developed, in no acute distress  HEENT: normal  Neck: no JVD, carotid bruits, or masses Cardiac: ***RRR; no murmurs, rubs, or gallops,no edema  Respiratory:  clear to auscultation bilaterally, normal work of breathing GI: soft, nontender, nondistended, + BS MS: no deformity or atrophy  Skin: warm and dry.  [name] is going to family history there is no one in their Neuro:  Strength and sensation are intact Psych: euthymic mood, full affect  EKG:  EKG {ACTION; IS/IS [id number]} ordered today. Personal review of the ekg ordered shows ***   Recent Labs: No results found for requested labs within last 8760 hours.    Lipid Panel  No results found for: CHOL, TRIG, HDL, CHOLHDL, VLDL, LDLCALC, LDLDIRECT   Wt Readings from Last 3 Encounters:  03/09/18 141 lb (64 kg)  Other studies Reviewed: Additional studies/ records that were reviewed today include: Holter 03/30/18  Review of the above records today demonstrates:  Baseline rhythm: Sinus Minimum heart rate: 27 BPM at 4:15 AM.  Average heart rate: 59 BPM.  Maximal heart rate 109 BPM. Conduction abnormality: Patient had multiple episodes of second-degree AV block which was asymptomatic.  This predominantly occurred during sleep hours. Symptoms: None   ASSESSMENT AND PLAN:  1.  ***  2.  Hypertension:***  3.  Hyperlipidemia:***  Current medicines are reviewed at length with the patient today.   The patient {ACTIONS; HAS/DOES  NOT HAVE:19233} concerns regarding his medicines.  The following changes were made today:  {NONE DEFAULTED:18576::"none"}  Labs/ tests ordered today include: *** No orders of the defined types were placed in this encounter.    Disposition:   FU with Germaine Ripp {gen number [id number]} {Days to years:10300}  Signed, Jeffrey Howe Meredith Leeds, MD  04/13/2018 10:08 AM     [facility] [address] [place] [address] [phone] (office) [phone] (fax)

## 2018-04-13 NOTE — Progress Notes (Signed)
Electrophysiology Office Note   Date:  04/14/2018   ID:  Jeffrey Howe, DOB 10/15/1929, MRN [id number]  PCP:  Jeffrey Orn, MD  Cardiologist:  Howe Primary Electrophysiologist:  Jeffrey Perrow Jeffrey Leeds, MD    No chief complaint on file.    History of Present Illness: Jeffrey Howe is a 84 y.o. male who is being seen today for the evaluation of bradycardia at the request of Jeffrey Howe. Presenting today for electrophysiology evaluation.  He has a history of bradycardia, diabetes, hypertension.  He was initially referred for an abnormal EKG showing bradycardia.  He has no chest pain, orthopnea, or PND.  He is able to do all of his daily activities.  He does have some weakness and fatigue throughout the day.  His heart rate in clinic today is 77.    Today, he denies symptoms of palpitations, chest pain, shortness of breath, orthopnea, PND, lower extremity edema, claudication, dizziness, presyncope, syncope, bleeding, or neurologic sequela. The patient is tolerating medications without difficulties.    Past Medical History:  Diagnosis Date  . BPH (benign prostatic hyperplasia)   . Bradycardia 03/09/2018  . Diabetes ([place]rey Howe)   . ED (erectile dysfunction)   . Hypertension   . Vitamin B 12 deficiency    Past Surgical History:  Procedure Laterality Date  . HERNIA REPAIR       Current Outpatient Medications  Medication Sig Dispense Refill  . glipiZIDE (GLUCOTROL XL) 2.5 MG 24 hr tablet Take 2.5 mg by mouth daily with breakfast.    . lisinopril (PRINIVIL,ZESTRIL) 20 MG tablet Take 1 tablet by mouth daily.    . metFORMIN (GLUCOPHAGE) 500 MG tablet Take 1 tablet by mouth 2 (two) times daily with a meal.    . simvastatin (ZOCOR) 40 MG tablet Take 40 mg by mouth daily.    . tamsulosin (FLOMAX) 0.4 MG CAPS capsule Take 0.4 mg by mouth daily.     No current facility-administered medications for this visit.     Allergies:   Patient has no known allergies.   Social History:  The  patient  reports that he has quit smoking. His smoking use included pipe and cigars. He has never used smokeless tobacco.   Family History:  The patient's Family history is unknown by patient.    ROS:  Please see the history of present illness.   Otherwise, review of systems is positive for none.   All other systems are reviewed and negative.    PHYSICAL EXAM: VS:  BP 116/70   Pulse 77   Ht 5\' 6"  (1.676 m)   Wt 135 lb (61.2 kg)   SpO2 97%   BMI 21.79 kg/m  , BMI Body mass index is 21.79 kg/m. GEN: Well nourished, well developed, in no acute distress  HEENT: normal  Neck: no JVD, carotid bruits, or masses Cardiac: RRR; no murmurs, rubs, or gallops,no edema  Respiratory:  clear to auscultation bilaterally, normal work of breathing GI: soft, nontender, nondistended, + BS MS: no deformity or atrophy  Skin: warm and dry.  Mr. Jeffrey Howe is going to family history there is no one in their Neuro:  Strength and sensation are intact Psych: euthymic mood, full affect  EKG:  EKG is not ordered today. Personal review of the ekg ordered shows sinus rhythm, first-degree AV block   Recent Labs: No results found for requested labs within last 8760 hours.    Lipid Panel  No results found for: CHOL, TRIG, HDL, CHOLHDL, VLDL, LDLCALC, LDLDIRECT  Wt Readings from Last 3 Encounters:  04/13/18 135 lb (61.2 kg)  03/09/18 141 lb (64 kg)      Other studies Reviewed: Additional studies/ records that were reviewed today include: Holter 03/30/18  Review of the above records today demonstrates:  Baseline rhythm: Sinus Minimum heart rate: 27 BPM at 4:15 AM.  Average heart rate: 59 BPM.  Maximal heart rate 109 BPM. Conduction abnormality: Patient had multiple episodes of second-degree AV block which was asymptomatic.  This predominantly occurred during sleep hours. Symptoms: None   ASSESSMENT AND PLAN:  1.  Second-degree AV block: Found on heart monitor.  Occurs mainly at night when.  He is  having more bradycardia during the day.  I have asked him to keep track of when he is weak and fatigued throughout the day.  He Jeffrey Howe get a pulse monitor and check his pulse at this time.  If his heart rate is getting down into the 30s to 40s associated with fatigue, he may benefit from pacemaker implant.  2.  Hypertension: Well-controlled.  No changes.  3.  Hyperlipidemia: Per primary physician  Current medicines are reviewed at length with the patient today.   The patient does not have concerns regarding his medicines.  The following changes were made today:  none  Labs/ tests ordered today include:  No orders of the defined types were placed in this encounter.  Case discussed with primary cardiology  Disposition:   FU with Jeffrey Howe 3 months  Signed, Jeffrey Howe Jeffrey Leeds, MD  04/14/2018 7:37 AM     [facility] [address] [place]  [place] [address] [phone] (office) [phone] (fax)

## 2018-04-14 ENCOUNTER — Encounter: Payer: Self-pay | Admitting: Cardiology

## 2018-04-24 ENCOUNTER — Other Ambulatory Visit: Payer: Medicare Other

## 2018-04-27 ENCOUNTER — Ambulatory Visit (INDEPENDENT_AMBULATORY_CARE_PROVIDER_SITE_OTHER): Payer: Medicare Other

## 2018-04-27 DIAGNOSIS — R001 Bradycardia, unspecified: Secondary | ICD-10-CM | POA: Diagnosis not present

## 2018-04-27 NOTE — Progress Notes (Signed)
Complete echocardiogram has been performed.  Jimmy Elster Corbello RDCS, RVT 

## 2018-04-28 ENCOUNTER — Telehealth: Payer: Self-pay

## 2018-04-28 NOTE — Telephone Encounter (Signed)
Left voicemail for the patient to call the office regarding echo.

## 2018-04-28 NOTE — Telephone Encounter (Signed)
-----   Message from Jenean Lindau, MD sent at 04/28/2018  8:45 AM EST ----- The results of the study is unremarkable. Please inform patient. I will discuss in detail at next appointment. Cc  primary care/referring physician Jenean Lindau, MD 04/28/2018 8:45 AM

## 2018-04-28 NOTE — Telephone Encounter (Signed)
Patient called and notified of test results. 

## 2018-06-15 DIAGNOSIS — G3184 Mild cognitive impairment, so stated: Secondary | ICD-10-CM | POA: Diagnosis not present

## 2018-06-15 DIAGNOSIS — K222 Esophageal obstruction: Secondary | ICD-10-CM | POA: Diagnosis not present

## 2018-06-15 DIAGNOSIS — N400 Enlarged prostate without lower urinary tract symptoms: Secondary | ICD-10-CM | POA: Diagnosis not present

## 2018-06-15 DIAGNOSIS — I10 Essential (primary) hypertension: Secondary | ICD-10-CM | POA: Diagnosis not present

## 2018-06-15 DIAGNOSIS — E119 Type 2 diabetes mellitus without complications: Secondary | ICD-10-CM | POA: Diagnosis not present

## 2018-06-22 ENCOUNTER — Encounter: Payer: Medicare Other | Admitting: Cardiology

## 2018-07-13 ENCOUNTER — Ambulatory Visit (INDEPENDENT_AMBULATORY_CARE_PROVIDER_SITE_OTHER): Payer: Medicare Other | Admitting: Cardiology

## 2018-07-13 ENCOUNTER — Ambulatory Visit: Payer: Medicare Other | Admitting: Cardiology

## 2018-07-13 ENCOUNTER — Encounter: Payer: Self-pay | Admitting: Cardiology

## 2018-07-13 VITALS — BP 122/72 | HR 60 | Ht 66.0 in | Wt 143.0 lb

## 2018-07-13 DIAGNOSIS — I441 Atrioventricular block, second degree: Secondary | ICD-10-CM

## 2018-07-13 NOTE — Patient Instructions (Addendum)
Medication Instructions:  Your physician recommends that you continue on your current medications as directed. Please refer to the Current Medication list given to you today.  If you need a refill on your cardiac medications before your next appointment, please call your pharmacy.   Labwork: None ordered  Testing/Procedures: None ordered  Follow-Up: Your physician wants you to follow-up in: 6 months with Dr. Camnitz.  You will receive a reminder letter in the mail two months in advance. If you don't receive a letter, please call our office to schedule the follow-up appointment.  Thank you for choosing [facility]!!   Versie Fleener, RN [phone]         

## 2018-07-13 NOTE — Progress Notes (Signed)
Electrophysiology Office Note   Date:  07/13/2018   ID:  Jeffrey Howe, DOB Oct 09, 1929, MRN [id number]  PCP:  Lavone Orn, MD  Cardiologist:  Revankar Primary Electrophysiologist:  Amoura Ransier Meredith Leeds, MD    No chief complaint on file.    History of Present Illness: Jeffrey Howe is a 85 y.o. male who is being seen today for the evaluation of bradycardia at the request of Sunny Schlein Revankar. Presenting today for electrophysiology evaluation.  He has a history of bradycardia, diabetes, hypertension.  He was initially referred for an abnormal EKG showing bradycardia.  He has no chest pain, orthopnea, or PND.  He is able to do all of his daily activities.  He does have some weakness and fatigue throughout the day.  His heart rate in clinic today is 77.  Today, denies symptoms of palpitations, chest pain, shortness of breath, orthopnea, PND, lower extremity edema, claudication, dizziness, presyncope, syncope, bleeding, or neurologic sequela. The patient is tolerating medications without difficulties.  Overall he is doing well.  He has no chest pain or shortness of breath.  He is able to do all of his daily activities without restriction.  He has not had weakness, fatigue, syncope, shortness of breath.   Past Medical History:  Diagnosis Date  . BPH (benign prostatic hyperplasia)   . Bradycardia 03/09/2018  . Diabetes ([place])   . ED (erectile dysfunction)   . Hypertension   . Vitamin B 12 deficiency    Past Surgical History:  Procedure Laterality Date  . HERNIA REPAIR       Current Outpatient Medications  Medication Sig Dispense Refill  . glipiZIDE (GLUCOTROL XL) 2.5 MG 24 hr tablet Take 2.5 mg by mouth daily with breakfast.    . lisinopril (PRINIVIL,ZESTRIL) 20 MG tablet Take 1 tablet by mouth daily.    . metFORMIN (GLUCOPHAGE) 500 MG tablet Take 1 tablet by mouth 2 (two) times daily with a meal.    . simvastatin (ZOCOR) 40 MG tablet Take 40 mg by mouth daily.    . tamsulosin (FLOMAX)  0.4 MG CAPS capsule Take 0.4 mg by mouth daily.     No current facility-administered medications for this visit.     Allergies:   Patient has no known allergies.   Social History:  The patient  reports that he has quit smoking. His smoking use included pipe and cigars. He has never used smokeless tobacco.   Family History:  The patient's Family history is unknown by patient.    ROS:  Please see the history of present illness.   Otherwise, review of systems is positive for none.   All other systems are reviewed and negative.   PHYSICAL EXAM: VS:  BP 122/72   Pulse 60   Ht 5\' 6"  (1.676 m)   Wt 143 lb (64.9 kg)   BMI 23.08 kg/m  , BMI Body mass index is 23.08 kg/m. GEN: Well nourished, well developed, in no acute distress  HEENT: normal  Neck: no JVD, carotid bruits, or masses Cardiac: RRR; no murmurs, rubs, or gallops,no edema  Respiratory:  clear to auscultation bilaterally, normal work of breathing GI: soft, nontender, nondistended, + BS MS: no deformity or atrophy  Skin: warm and dry Neuro:  Strength and sensation are intact Psych: euthymic mood, full affect  EKG:  EKG is not ordered today. Personal review of the ekg ordered 02/11/18 shows sinus rhythm, Mobitz 1 AV block   Recent Labs: No results found for requested labs within last 8760  hours.    Lipid Panel  No results found for: CHOL, TRIG, HDL, CHOLHDL, VLDL, LDLCALC, LDLDIRECT   Wt Readings from Last 3 Encounters:  07/13/18 143 lb (64.9 kg)  04/13/18 135 lb (61.2 kg)  03/09/18 141 lb (64 kg)      Other studies Reviewed: Additional studies/ records that were reviewed today include: Holter 03/30/18 personally reviewed Review of the above records today demonstrates:  Baseline rhythm: Sinus Minimum heart rate: 27 BPM at 4:15 AM.  Average heart rate: 59 BPM.  Maximal heart rate 109 BPM. Conduction abnormality: Patient had multiple episodes of second-degree AV block which was asymptomatic.  This predominantly  occurred during sleep hours. Symptoms: None   ASSESSMENT AND PLAN:  1.  Second-degree AV block: Cardiac monitor.  Occurs mainly at night though he is having some bradycardia during the day.  He has not felt much in the way of weakness, fatigue, shortness of breath.  He has not had episodes of syncope.  We Maneh Sieben continue to monitor.  Avoiding AV nodal blocking agents  2.  Hypertension: Currently well controlled  3.  Hyperlipidemia: Per primary physician  Current medicines are reviewed at length with the patient today.   The patient does not have concerns regarding his medicines.  The following changes were made today: None  Labs/ tests ordered today include:  No orders of the defined types were placed in this encounter.   Disposition:   FU with Patrick Sohm 6 months  Signed, Amayra Kiedrowski Meredith Leeds, MD  07/13/2018 10:23 AM     [facility] [address] [place] [address] [phone] (office) [phone] (fax)

## 2018-10-12 DIAGNOSIS — G3184 Mild cognitive impairment, so stated: Secondary | ICD-10-CM | POA: Diagnosis not present

## 2018-10-12 DIAGNOSIS — I441 Atrioventricular block, second degree: Secondary | ICD-10-CM | POA: Diagnosis not present

## 2018-10-12 DIAGNOSIS — Z7984 Long term (current) use of oral hypoglycemic drugs: Secondary | ICD-10-CM | POA: Diagnosis not present

## 2018-10-12 DIAGNOSIS — I10 Essential (primary) hypertension: Secondary | ICD-10-CM | POA: Diagnosis not present

## 2018-10-12 DIAGNOSIS — E1169 Type 2 diabetes mellitus with other specified complication: Secondary | ICD-10-CM | POA: Diagnosis not present

## 2018-10-12 DIAGNOSIS — K219 Gastro-esophageal reflux disease without esophagitis: Secondary | ICD-10-CM | POA: Diagnosis not present

## 2018-12-21 DIAGNOSIS — H60333 Swimmer's ear, bilateral: Secondary | ICD-10-CM | POA: Diagnosis not present

## 2018-12-21 DIAGNOSIS — H9201 Otalgia, right ear: Secondary | ICD-10-CM | POA: Diagnosis not present

## 2018-12-21 DIAGNOSIS — H6123 Impacted cerumen, bilateral: Secondary | ICD-10-CM | POA: Diagnosis not present

## 2018-12-21 DIAGNOSIS — I10 Essential (primary) hypertension: Secondary | ICD-10-CM | POA: Diagnosis not present

## 2019-02-22 DIAGNOSIS — Z0000 Encounter for general adult medical examination without abnormal findings: Secondary | ICD-10-CM | POA: Diagnosis not present

## 2019-02-22 DIAGNOSIS — Z1389 Encounter for screening for other disorder: Secondary | ICD-10-CM | POA: Diagnosis not present

## 2019-03-01 DIAGNOSIS — E7800 Pure hypercholesterolemia, unspecified: Secondary | ICD-10-CM | POA: Diagnosis not present

## 2019-03-01 DIAGNOSIS — F0390 Unspecified dementia without behavioral disturbance: Secondary | ICD-10-CM | POA: Diagnosis not present

## 2019-03-01 DIAGNOSIS — I10 Essential (primary) hypertension: Secondary | ICD-10-CM | POA: Diagnosis not present

## 2019-03-01 DIAGNOSIS — Z23 Encounter for immunization: Secondary | ICD-10-CM | POA: Diagnosis not present

## 2019-03-01 DIAGNOSIS — K219 Gastro-esophageal reflux disease without esophagitis: Secondary | ICD-10-CM | POA: Diagnosis not present

## 2019-03-01 DIAGNOSIS — E1169 Type 2 diabetes mellitus with other specified complication: Secondary | ICD-10-CM | POA: Diagnosis not present

## 2019-03-01 DIAGNOSIS — I441 Atrioventricular block, second degree: Secondary | ICD-10-CM | POA: Diagnosis not present

## 2019-03-01 DIAGNOSIS — N400 Enlarged prostate without lower urinary tract symptoms: Secondary | ICD-10-CM | POA: Diagnosis not present

## 2019-03-01 DIAGNOSIS — E538 Deficiency of other specified B group vitamins: Secondary | ICD-10-CM | POA: Diagnosis not present

## 2019-03-18 DIAGNOSIS — F0390 Unspecified dementia without behavioral disturbance: Secondary | ICD-10-CM | POA: Diagnosis not present

## 2019-03-18 DIAGNOSIS — N400 Enlarged prostate without lower urinary tract symptoms: Secondary | ICD-10-CM | POA: Diagnosis not present

## 2019-03-18 DIAGNOSIS — E1169 Type 2 diabetes mellitus with other specified complication: Secondary | ICD-10-CM | POA: Diagnosis not present

## 2019-03-18 DIAGNOSIS — Z7984 Long term (current) use of oral hypoglycemic drugs: Secondary | ICD-10-CM | POA: Diagnosis not present

## 2019-03-18 DIAGNOSIS — I10 Essential (primary) hypertension: Secondary | ICD-10-CM | POA: Diagnosis not present

## 2019-03-18 DIAGNOSIS — E7800 Pure hypercholesterolemia, unspecified: Secondary | ICD-10-CM | POA: Diagnosis not present

## 2019-04-20 DIAGNOSIS — E7800 Pure hypercholesterolemia, unspecified: Secondary | ICD-10-CM | POA: Diagnosis not present

## 2019-04-20 DIAGNOSIS — N400 Enlarged prostate without lower urinary tract symptoms: Secondary | ICD-10-CM | POA: Diagnosis not present

## 2019-04-20 DIAGNOSIS — I10 Essential (primary) hypertension: Secondary | ICD-10-CM | POA: Diagnosis not present

## 2019-04-20 DIAGNOSIS — F0390 Unspecified dementia without behavioral disturbance: Secondary | ICD-10-CM | POA: Diagnosis not present

## 2019-04-20 DIAGNOSIS — E1169 Type 2 diabetes mellitus with other specified complication: Secondary | ICD-10-CM | POA: Diagnosis not present

## 2019-05-26 DIAGNOSIS — I10 Essential (primary) hypertension: Secondary | ICD-10-CM | POA: Diagnosis not present

## 2019-05-26 DIAGNOSIS — E7800 Pure hypercholesterolemia, unspecified: Secondary | ICD-10-CM | POA: Diagnosis not present

## 2019-05-26 DIAGNOSIS — F0390 Unspecified dementia without behavioral disturbance: Secondary | ICD-10-CM | POA: Diagnosis not present

## 2019-05-26 DIAGNOSIS — N400 Enlarged prostate without lower urinary tract symptoms: Secondary | ICD-10-CM | POA: Diagnosis not present

## 2019-05-26 DIAGNOSIS — Z7984 Long term (current) use of oral hypoglycemic drugs: Secondary | ICD-10-CM | POA: Diagnosis not present

## 2019-05-26 DIAGNOSIS — E1169 Type 2 diabetes mellitus with other specified complication: Secondary | ICD-10-CM | POA: Diagnosis not present

## 2019-05-31 ENCOUNTER — Ambulatory Visit (INDEPENDENT_AMBULATORY_CARE_PROVIDER_SITE_OTHER): Payer: Medicare Other | Admitting: Otolaryngology

## 2019-05-31 ENCOUNTER — Other Ambulatory Visit: Payer: Self-pay

## 2019-05-31 DIAGNOSIS — H6123 Impacted cerumen, bilateral: Secondary | ICD-10-CM | POA: Diagnosis not present

## 2019-05-31 NOTE — Progress Notes (Signed)
HPI: Stellar Rog is a 85 y.o. male who presents for evaluation of cerumen buildup.  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  He returns earlier for recheck most of the right ear.  He is doing well presently with no complaints of pain or discomfort..  Past Medical History:  Diagnosis Date  . BPH (benign prostatic hyperplasia)   . Bradycardia 03/09/2018  . Diabetes ([place])   . ED (erectile dysfunction)   . Hypertension   . Vitamin B 12 deficiency    Past Surgical History:  Procedure Laterality Date  . HERNIA REPAIR     Social History   Socioeconomic History  . Marital status: Married    Spouse name: Not on file  . Number of children: Not on file  . Years of education: Not on file  . Highest education level: Not on file  Occupational History  . Not on file  Tobacco Use  . Smoking status: Former Smoker    Types: Pipe, Landscape architect  . Smokeless tobacco: Never Used  Substance and Sexual Activity  . Alcohol use: Not on file  . Drug use: Not on file  . Sexual activity: Not on file  Other Topics Concern  . Not on file  Social History Narrative  . Not on file   Social Determinants of Health   Financial Resource Strain:   . Difficulty of Paying Living Expenses: Not on file  Food Insecurity:   . Worried About Charity fundraiser in the Last Year: Not on file  . Ran Out of Food in the Last Year: Not on file  Transportation Needs:   . Lack of Transportation (Medical): Not on file  . Lack of Transportation (Non-Medical): Not on file  Physical Activity:   . Days of Exercise per Week: Not on file  . Minutes of Exercise per Session: Not on file  Stress:   . Feeling of Stress : Not on file  Social Connections:   . Frequency of Communication with Friends and Family: Not on file  . Frequency of Social Gatherings with Friends  and Family: Not on file  . Attends Religious Services: Not on file  . Active Member of Clubs or Organizations: Not on file  . Attends Archivist Meetings: Not on file  . Marital Status: Not on file   Family History  Family history unknown: Yes   No Known Allergies Prior to Admission medications   Medication Sig Start Date End Date Taking? Authorizing Provider  glipiZIDE (GLUCOTROL XL) 2.5 MG 24 hr tablet Take 2.5 mg by mouth daily with breakfast.    [provider]  lisinopril (PRINIVIL,ZESTRIL) 20 MG tablet Take 1 tablet by mouth daily. 02/19/18   [provider]  metFORMIN (GLUCOPHAGE) 500 MG tablet Take 1 tablet by mouth 2 (two) times daily with a meal.    [provider]  simvastatin (ZOCOR) 40 MG tablet Take 40 mg by mouth daily.    [provider]  tamsulosin (FLOMAX) 0.4 MG CAPS capsule Take 0.4 mg by mouth daily.    [provider]     Positive ROS: Negative  All other systems have been reviewed and were otherwise negative with the exception of those mentioned in the HPI and as above.  Physical Exam: Constitutional: Alert, well-appearing, no acute distress Ears: External  ears without lesions or tenderness. Ear canals had minimal wax on the left side that was cleaned with a curette.  Right ear canal had more cerumen with a ball of cerumen inferiorly within the ear canal where some of the bone eroded.  This was removed and cleaned with forceps and suction and applied CSF powder to the right ear only.. Nasal: External nose without lesions. Clear nasal passages Oral: Oropharynx clear. Neck: No palpable adenopathy or masses Respiratory: Breathing comfortably  Skin: No facial/neck lesions or rash noted.  Cerumen impaction removal  Date/Time: 05/31/2019 2:24 PM Performed by: Rozetta Nunnery, MD Authorized by: Rozetta Nunnery, MD   Consent:    Consent obtained:  Verbal   Consent given by:  Patient   Risks  discussed:  Pain and bleeding Procedure details:    Location:  L ear and R ear Post-procedure details:    Inspection:  TM intact and canal normal   Hearing quality:  Improved   Patient tolerance of procedure:  Tolerated well, no immediate complications Comments:     Right ear had a defect in the bony canal wall that was cleaned in the office with forceps and applied CSF powder to the right ear.    Assessment: Cerumen impaction  Plan: He will follow-up in 11 to 12 months for recheck and cleaning.  Radene Journey, MD

## 2019-07-15 DIAGNOSIS — Z7984 Long term (current) use of oral hypoglycemic drugs: Secondary | ICD-10-CM | POA: Diagnosis not present

## 2019-07-15 DIAGNOSIS — I10 Essential (primary) hypertension: Secondary | ICD-10-CM | POA: Diagnosis not present

## 2019-07-15 DIAGNOSIS — F0390 Unspecified dementia without behavioral disturbance: Secondary | ICD-10-CM | POA: Diagnosis not present

## 2019-07-15 DIAGNOSIS — E7800 Pure hypercholesterolemia, unspecified: Secondary | ICD-10-CM | POA: Diagnosis not present

## 2019-07-15 DIAGNOSIS — N400 Enlarged prostate without lower urinary tract symptoms: Secondary | ICD-10-CM | POA: Diagnosis not present

## 2019-07-15 DIAGNOSIS — E1169 Type 2 diabetes mellitus with other specified complication: Secondary | ICD-10-CM | POA: Diagnosis not present

## 2019-08-09 DIAGNOSIS — L821 Other seborrheic keratosis: Secondary | ICD-10-CM | POA: Diagnosis not present

## 2019-08-09 DIAGNOSIS — D229 Melanocytic nevi, unspecified: Secondary | ICD-10-CM | POA: Diagnosis not present

## 2019-08-09 DIAGNOSIS — L570 Actinic keratosis: Secondary | ICD-10-CM | POA: Diagnosis not present

## 2019-08-09 DIAGNOSIS — L814 Other melanin hyperpigmentation: Secondary | ICD-10-CM | POA: Diagnosis not present

## 2019-08-09 DIAGNOSIS — L111 Transient acantholytic dermatosis [Grover]: Secondary | ICD-10-CM | POA: Diagnosis not present

## 2019-09-24 DIAGNOSIS — F0390 Unspecified dementia without behavioral disturbance: Secondary | ICD-10-CM | POA: Diagnosis not present

## 2019-09-24 DIAGNOSIS — I10 Essential (primary) hypertension: Secondary | ICD-10-CM | POA: Diagnosis not present

## 2019-09-24 DIAGNOSIS — E1169 Type 2 diabetes mellitus with other specified complication: Secondary | ICD-10-CM | POA: Diagnosis not present

## 2019-09-24 DIAGNOSIS — E7800 Pure hypercholesterolemia, unspecified: Secondary | ICD-10-CM | POA: Diagnosis not present

## 2019-09-24 DIAGNOSIS — N400 Enlarged prostate without lower urinary tract symptoms: Secondary | ICD-10-CM | POA: Diagnosis not present

## 2019-09-24 DIAGNOSIS — Z7984 Long term (current) use of oral hypoglycemic drugs: Secondary | ICD-10-CM | POA: Diagnosis not present

## 2019-10-04 DIAGNOSIS — K219 Gastro-esophageal reflux disease without esophagitis: Secondary | ICD-10-CM | POA: Diagnosis not present

## 2019-10-04 DIAGNOSIS — E119 Type 2 diabetes mellitus without complications: Secondary | ICD-10-CM | POA: Diagnosis not present

## 2019-10-04 DIAGNOSIS — I10 Essential (primary) hypertension: Secondary | ICD-10-CM | POA: Diagnosis not present

## 2019-10-04 DIAGNOSIS — E1169 Type 2 diabetes mellitus with other specified complication: Secondary | ICD-10-CM | POA: Diagnosis not present

## 2019-10-04 DIAGNOSIS — Z7984 Long term (current) use of oral hypoglycemic drugs: Secondary | ICD-10-CM | POA: Diagnosis not present

## 2019-10-04 DIAGNOSIS — I441 Atrioventricular block, second degree: Secondary | ICD-10-CM | POA: Diagnosis not present

## 2019-10-04 DIAGNOSIS — F0390 Unspecified dementia without behavioral disturbance: Secondary | ICD-10-CM | POA: Diagnosis not present

## 2019-12-09 DIAGNOSIS — E1169 Type 2 diabetes mellitus with other specified complication: Secondary | ICD-10-CM | POA: Diagnosis not present

## 2019-12-09 DIAGNOSIS — I10 Essential (primary) hypertension: Secondary | ICD-10-CM | POA: Diagnosis not present

## 2019-12-09 DIAGNOSIS — N400 Enlarged prostate without lower urinary tract symptoms: Secondary | ICD-10-CM | POA: Diagnosis not present

## 2019-12-09 DIAGNOSIS — F0390 Unspecified dementia without behavioral disturbance: Secondary | ICD-10-CM | POA: Diagnosis not present

## 2019-12-09 DIAGNOSIS — E7800 Pure hypercholesterolemia, unspecified: Secondary | ICD-10-CM | POA: Diagnosis not present

## 2020-02-14 DIAGNOSIS — H353131 Nonexudative age-related macular degeneration, bilateral, early dry stage: Secondary | ICD-10-CM | POA: Diagnosis not present

## 2020-02-14 DIAGNOSIS — H52203 Unspecified astigmatism, bilateral: Secondary | ICD-10-CM | POA: Diagnosis not present

## 2020-02-14 DIAGNOSIS — E119 Type 2 diabetes mellitus without complications: Secondary | ICD-10-CM | POA: Diagnosis not present

## 2020-02-21 DIAGNOSIS — F0390 Unspecified dementia without behavioral disturbance: Secondary | ICD-10-CM | POA: Diagnosis not present

## 2020-02-21 DIAGNOSIS — E7800 Pure hypercholesterolemia, unspecified: Secondary | ICD-10-CM | POA: Diagnosis not present

## 2020-02-21 DIAGNOSIS — N400 Enlarged prostate without lower urinary tract symptoms: Secondary | ICD-10-CM | POA: Diagnosis not present

## 2020-02-21 DIAGNOSIS — I10 Essential (primary) hypertension: Secondary | ICD-10-CM | POA: Diagnosis not present

## 2020-02-21 DIAGNOSIS — E1169 Type 2 diabetes mellitus with other specified complication: Secondary | ICD-10-CM | POA: Diagnosis not present

## 2020-05-08 ENCOUNTER — Ambulatory Visit (INDEPENDENT_AMBULATORY_CARE_PROVIDER_SITE_OTHER): Payer: Medicare Other | Admitting: Otolaryngology

## 2020-05-10 DIAGNOSIS — Z23 Encounter for immunization: Secondary | ICD-10-CM | POA: Diagnosis not present

## 2020-05-11 DIAGNOSIS — N400 Enlarged prostate without lower urinary tract symptoms: Secondary | ICD-10-CM | POA: Diagnosis not present

## 2020-05-11 DIAGNOSIS — F0390 Unspecified dementia without behavioral disturbance: Secondary | ICD-10-CM | POA: Diagnosis not present

## 2020-05-11 DIAGNOSIS — K219 Gastro-esophageal reflux disease without esophagitis: Secondary | ICD-10-CM | POA: Diagnosis not present

## 2020-05-11 DIAGNOSIS — I10 Essential (primary) hypertension: Secondary | ICD-10-CM | POA: Diagnosis not present

## 2020-05-11 DIAGNOSIS — E7800 Pure hypercholesterolemia, unspecified: Secondary | ICD-10-CM | POA: Diagnosis not present

## 2020-05-11 DIAGNOSIS — E1169 Type 2 diabetes mellitus with other specified complication: Secondary | ICD-10-CM | POA: Diagnosis not present

## 2020-07-28 DIAGNOSIS — F0390 Unspecified dementia without behavioral disturbance: Secondary | ICD-10-CM | POA: Diagnosis not present

## 2020-07-28 DIAGNOSIS — K219 Gastro-esophageal reflux disease without esophagitis: Secondary | ICD-10-CM | POA: Diagnosis not present

## 2020-07-28 DIAGNOSIS — K222 Esophageal obstruction: Secondary | ICD-10-CM | POA: Diagnosis not present

## 2020-07-28 DIAGNOSIS — E1151 Type 2 diabetes mellitus with diabetic peripheral angiopathy without gangrene: Secondary | ICD-10-CM | POA: Diagnosis not present

## 2020-07-28 DIAGNOSIS — Z1389 Encounter for screening for other disorder: Secondary | ICD-10-CM | POA: Diagnosis not present

## 2020-07-28 DIAGNOSIS — Z0000 Encounter for general adult medical examination without abnormal findings: Secondary | ICD-10-CM | POA: Diagnosis not present

## 2020-07-28 DIAGNOSIS — Z23 Encounter for immunization: Secondary | ICD-10-CM | POA: Diagnosis not present

## 2020-07-28 DIAGNOSIS — E1142 Type 2 diabetes mellitus with diabetic polyneuropathy: Secondary | ICD-10-CM | POA: Diagnosis not present

## 2020-07-28 DIAGNOSIS — I10 Essential (primary) hypertension: Secondary | ICD-10-CM | POA: Diagnosis not present

## 2020-09-26 DIAGNOSIS — E1169 Type 2 diabetes mellitus with other specified complication: Secondary | ICD-10-CM | POA: Diagnosis not present

## 2020-09-26 DIAGNOSIS — E1151 Type 2 diabetes mellitus with diabetic peripheral angiopathy without gangrene: Secondary | ICD-10-CM | POA: Diagnosis not present

## 2020-09-26 DIAGNOSIS — E7800 Pure hypercholesterolemia, unspecified: Secondary | ICD-10-CM | POA: Diagnosis not present

## 2020-09-26 DIAGNOSIS — E1142 Type 2 diabetes mellitus with diabetic polyneuropathy: Secondary | ICD-10-CM | POA: Diagnosis not present

## 2020-09-26 DIAGNOSIS — K219 Gastro-esophageal reflux disease without esophagitis: Secondary | ICD-10-CM | POA: Diagnosis not present

## 2020-09-26 DIAGNOSIS — I10 Essential (primary) hypertension: Secondary | ICD-10-CM | POA: Diagnosis not present

## 2020-09-26 DIAGNOSIS — F0390 Unspecified dementia without behavioral disturbance: Secondary | ICD-10-CM | POA: Diagnosis not present

## 2020-09-26 DIAGNOSIS — N400 Enlarged prostate without lower urinary tract symptoms: Secondary | ICD-10-CM | POA: Diagnosis not present

## 2020-11-27 DIAGNOSIS — I10 Essential (primary) hypertension: Secondary | ICD-10-CM | POA: Diagnosis not present

## 2020-11-27 DIAGNOSIS — K219 Gastro-esophageal reflux disease without esophagitis: Secondary | ICD-10-CM | POA: Diagnosis not present

## 2020-11-27 DIAGNOSIS — E1169 Type 2 diabetes mellitus with other specified complication: Secondary | ICD-10-CM | POA: Diagnosis not present

## 2020-11-27 DIAGNOSIS — E7800 Pure hypercholesterolemia, unspecified: Secondary | ICD-10-CM | POA: Diagnosis not present

## 2020-11-27 DIAGNOSIS — E1142 Type 2 diabetes mellitus with diabetic polyneuropathy: Secondary | ICD-10-CM | POA: Diagnosis not present

## 2020-11-27 DIAGNOSIS — N400 Enlarged prostate without lower urinary tract symptoms: Secondary | ICD-10-CM | POA: Diagnosis not present

## 2020-11-27 DIAGNOSIS — E1151 Type 2 diabetes mellitus with diabetic peripheral angiopathy without gangrene: Secondary | ICD-10-CM | POA: Diagnosis not present

## 2020-11-27 DIAGNOSIS — F0390 Unspecified dementia without behavioral disturbance: Secondary | ICD-10-CM | POA: Diagnosis not present

## 2020-12-29 DIAGNOSIS — Z23 Encounter for immunization: Secondary | ICD-10-CM | POA: Diagnosis not present

## 2020-12-29 DIAGNOSIS — Z7984 Long term (current) use of oral hypoglycemic drugs: Secondary | ICD-10-CM | POA: Diagnosis not present

## 2020-12-29 DIAGNOSIS — I10 Essential (primary) hypertension: Secondary | ICD-10-CM | POA: Diagnosis not present

## 2020-12-29 DIAGNOSIS — R609 Edema, unspecified: Secondary | ICD-10-CM | POA: Diagnosis not present

## 2020-12-29 DIAGNOSIS — E1169 Type 2 diabetes mellitus with other specified complication: Secondary | ICD-10-CM | POA: Diagnosis not present

## 2021-01-25 DIAGNOSIS — I10 Essential (primary) hypertension: Secondary | ICD-10-CM | POA: Diagnosis not present

## 2021-01-25 DIAGNOSIS — E7800 Pure hypercholesterolemia, unspecified: Secondary | ICD-10-CM | POA: Diagnosis not present

## 2021-01-25 DIAGNOSIS — E1142 Type 2 diabetes mellitus with diabetic polyneuropathy: Secondary | ICD-10-CM | POA: Diagnosis not present

## 2021-01-25 DIAGNOSIS — K219 Gastro-esophageal reflux disease without esophagitis: Secondary | ICD-10-CM | POA: Diagnosis not present

## 2021-01-25 DIAGNOSIS — F0390 Unspecified dementia without behavioral disturbance: Secondary | ICD-10-CM | POA: Diagnosis not present

## 2021-01-25 DIAGNOSIS — N400 Enlarged prostate without lower urinary tract symptoms: Secondary | ICD-10-CM | POA: Diagnosis not present

## 2021-01-25 DIAGNOSIS — E1169 Type 2 diabetes mellitus with other specified complication: Secondary | ICD-10-CM | POA: Diagnosis not present

## 2021-01-25 DIAGNOSIS — E1151 Type 2 diabetes mellitus with diabetic peripheral angiopathy without gangrene: Secondary | ICD-10-CM | POA: Diagnosis not present

## 2021-04-10 DIAGNOSIS — Z20822 Contact with and (suspected) exposure to covid-19: Secondary | ICD-10-CM | POA: Diagnosis not present

## 2021-04-23 DIAGNOSIS — Z20822 Contact with and (suspected) exposure to covid-19: Secondary | ICD-10-CM | POA: Diagnosis not present

## 2021-05-01 DIAGNOSIS — Z20822 Contact with and (suspected) exposure to covid-19: Secondary | ICD-10-CM | POA: Diagnosis not present

## 2021-05-07 DIAGNOSIS — E1021 Type 1 diabetes mellitus with diabetic nephropathy: Secondary | ICD-10-CM | POA: Diagnosis not present

## 2021-05-11 DIAGNOSIS — Z20822 Contact with and (suspected) exposure to covid-19: Secondary | ICD-10-CM | POA: Diagnosis not present

## 2021-06-01 DIAGNOSIS — Z20822 Contact with and (suspected) exposure to covid-19: Secondary | ICD-10-CM | POA: Diagnosis not present

## 2021-06-22 DIAGNOSIS — E7800 Pure hypercholesterolemia, unspecified: Secondary | ICD-10-CM | POA: Diagnosis not present

## 2021-06-22 DIAGNOSIS — I10 Essential (primary) hypertension: Secondary | ICD-10-CM | POA: Diagnosis not present

## 2021-06-22 DIAGNOSIS — E1169 Type 2 diabetes mellitus with other specified complication: Secondary | ICD-10-CM | POA: Diagnosis not present

## 2021-06-22 DIAGNOSIS — K219 Gastro-esophageal reflux disease without esophagitis: Secondary | ICD-10-CM | POA: Diagnosis not present

## 2021-08-02 DIAGNOSIS — I10 Essential (primary) hypertension: Secondary | ICD-10-CM | POA: Diagnosis not present

## 2021-08-02 DIAGNOSIS — I700 Atherosclerosis of aorta: Secondary | ICD-10-CM | POA: Diagnosis not present

## 2021-08-02 DIAGNOSIS — R310 Gross hematuria: Secondary | ICD-10-CM | POA: Diagnosis not present

## 2021-08-02 DIAGNOSIS — K222 Esophageal obstruction: Secondary | ICD-10-CM | POA: Diagnosis not present

## 2021-08-02 DIAGNOSIS — E440 Moderate protein-calorie malnutrition: Secondary | ICD-10-CM | POA: Diagnosis not present

## 2021-08-02 DIAGNOSIS — R531 Weakness: Secondary | ICD-10-CM | POA: Diagnosis not present

## 2021-08-02 DIAGNOSIS — S40012A Contusion of left shoulder, initial encounter: Secondary | ICD-10-CM | POA: Diagnosis not present

## 2021-08-02 DIAGNOSIS — I361 Nonrheumatic tricuspid (valve) insufficiency: Secondary | ICD-10-CM | POA: Diagnosis not present

## 2021-08-02 DIAGNOSIS — I440 Atrioventricular block, first degree: Secondary | ICD-10-CM | POA: Diagnosis not present

## 2021-08-02 DIAGNOSIS — M47812 Spondylosis without myelopathy or radiculopathy, cervical region: Secondary | ICD-10-CM | POA: Diagnosis not present

## 2021-08-02 DIAGNOSIS — J9811 Atelectasis: Secondary | ICD-10-CM | POA: Diagnosis not present

## 2021-08-02 DIAGNOSIS — N134 Hydroureter: Secondary | ICD-10-CM | POA: Diagnosis not present

## 2021-08-02 DIAGNOSIS — J690 Pneumonitis due to inhalation of food and vomit: Secondary | ICD-10-CM | POA: Diagnosis not present

## 2021-08-02 DIAGNOSIS — E1110 Type 2 diabetes mellitus with ketoacidosis without coma: Secondary | ICD-10-CM | POA: Diagnosis not present

## 2021-08-02 DIAGNOSIS — A419 Sepsis, unspecified organism: Secondary | ICD-10-CM | POA: Diagnosis not present

## 2021-08-02 DIAGNOSIS — W19XXXA Unspecified fall, initial encounter: Secondary | ICD-10-CM | POA: Diagnosis not present

## 2021-08-02 DIAGNOSIS — R001 Bradycardia, unspecified: Secondary | ICD-10-CM | POA: Diagnosis not present

## 2021-08-02 DIAGNOSIS — N138 Other obstructive and reflux uropathy: Secondary | ICD-10-CM | POA: Diagnosis not present

## 2021-08-02 DIAGNOSIS — R739 Hyperglycemia, unspecified: Secondary | ICD-10-CM | POA: Diagnosis not present

## 2021-08-02 DIAGNOSIS — Z043 Encounter for examination and observation following other accident: Secondary | ICD-10-CM | POA: Diagnosis not present

## 2021-08-02 DIAGNOSIS — U071 COVID-19: Secondary | ICD-10-CM | POA: Diagnosis not present

## 2021-08-02 DIAGNOSIS — S40022A Contusion of left upper arm, initial encounter: Secondary | ICD-10-CM | POA: Diagnosis not present

## 2021-08-02 DIAGNOSIS — S3993XA Unspecified injury of pelvis, initial encounter: Secondary | ICD-10-CM | POA: Diagnosis not present

## 2021-08-02 DIAGNOSIS — N281 Cyst of kidney, acquired: Secondary | ICD-10-CM | POA: Diagnosis not present

## 2021-08-02 DIAGNOSIS — T796XXA Traumatic ischemia of muscle, initial encounter: Secondary | ICD-10-CM | POA: Diagnosis not present

## 2021-08-02 DIAGNOSIS — S299XXA Unspecified injury of thorax, initial encounter: Secondary | ICD-10-CM | POA: Diagnosis not present

## 2021-08-02 DIAGNOSIS — N1330 Unspecified hydronephrosis: Secondary | ICD-10-CM | POA: Diagnosis not present

## 2021-08-02 DIAGNOSIS — S5002XA Contusion of left elbow, initial encounter: Secondary | ICD-10-CM | POA: Diagnosis not present

## 2021-08-02 DIAGNOSIS — I441 Atrioventricular block, second degree: Secondary | ICD-10-CM | POA: Diagnosis not present

## 2021-08-02 DIAGNOSIS — E1165 Type 2 diabetes mellitus with hyperglycemia: Secondary | ICD-10-CM | POA: Diagnosis not present

## 2021-08-02 DIAGNOSIS — N179 Acute kidney failure, unspecified: Secondary | ICD-10-CM | POA: Diagnosis not present

## 2021-08-02 DIAGNOSIS — S60212A Contusion of left wrist, initial encounter: Secondary | ICD-10-CM | POA: Diagnosis not present

## 2021-08-02 DIAGNOSIS — N200 Calculus of kidney: Secondary | ICD-10-CM | POA: Diagnosis not present

## 2021-08-04 DIAGNOSIS — I441 Atrioventricular block, second degree: Secondary | ICD-10-CM | POA: Diagnosis not present

## 2021-08-05 DIAGNOSIS — I440 Atrioventricular block, first degree: Secondary | ICD-10-CM | POA: Diagnosis not present

## 2021-08-05 DIAGNOSIS — I441 Atrioventricular block, second degree: Secondary | ICD-10-CM | POA: Diagnosis not present

## 2021-08-09 DIAGNOSIS — R001 Bradycardia, unspecified: Secondary | ICD-10-CM | POA: Diagnosis not present

## 2021-08-09 DIAGNOSIS — I361 Nonrheumatic tricuspid (valve) insufficiency: Secondary | ICD-10-CM | POA: Diagnosis not present

## 2021-08-09 DIAGNOSIS — I441 Atrioventricular block, second degree: Secondary | ICD-10-CM | POA: Diagnosis not present

## 2021-08-10 DIAGNOSIS — I440 Atrioventricular block, first degree: Secondary | ICD-10-CM | POA: Diagnosis not present

## 2021-08-10 DIAGNOSIS — I441 Atrioventricular block, second degree: Secondary | ICD-10-CM | POA: Diagnosis not present

## 2021-08-10 DIAGNOSIS — R001 Bradycardia, unspecified: Secondary | ICD-10-CM | POA: Diagnosis not present

## 2021-08-11 DIAGNOSIS — U071 COVID-19: Secondary | ICD-10-CM | POA: Diagnosis not present

## 2021-08-11 DIAGNOSIS — R001 Bradycardia, unspecified: Secondary | ICD-10-CM | POA: Diagnosis not present

## 2021-08-11 DIAGNOSIS — I441 Atrioventricular block, second degree: Secondary | ICD-10-CM | POA: Diagnosis not present

## 2021-08-12 DIAGNOSIS — I441 Atrioventricular block, second degree: Secondary | ICD-10-CM | POA: Diagnosis not present

## 2021-08-13 DIAGNOSIS — I440 Atrioventricular block, first degree: Secondary | ICD-10-CM | POA: Diagnosis not present

## 2021-08-24 DIAGNOSIS — F0390 Unspecified dementia without behavioral disturbance: Secondary | ICD-10-CM | POA: Diagnosis not present

## 2021-08-24 DIAGNOSIS — R262 Difficulty in walking, not elsewhere classified: Secondary | ICD-10-CM | POA: Diagnosis not present

## 2021-08-24 DIAGNOSIS — R5381 Other malaise: Secondary | ICD-10-CM | POA: Diagnosis not present

## 2021-08-24 DIAGNOSIS — E1151 Type 2 diabetes mellitus with diabetic peripheral angiopathy without gangrene: Secondary | ICD-10-CM | POA: Diagnosis not present

## 2021-08-30 DIAGNOSIS — F4320 Adjustment disorder, unspecified: Secondary | ICD-10-CM | POA: Diagnosis not present

## 2021-08-30 DIAGNOSIS — F03C0 Unspecified dementia, severe, without behavioral disturbance, psychotic disturbance, mood disturbance, and anxiety: Secondary | ICD-10-CM | POA: Diagnosis not present

## 2021-09-03 DIAGNOSIS — R531 Weakness: Secondary | ICD-10-CM | POA: Diagnosis not present

## 2021-09-03 DIAGNOSIS — N401 Enlarged prostate with lower urinary tract symptoms: Secondary | ICD-10-CM | POA: Diagnosis not present

## 2021-09-03 DIAGNOSIS — R339 Retention of urine, unspecified: Secondary | ICD-10-CM | POA: Diagnosis not present

## 2021-09-03 DIAGNOSIS — F0390 Unspecified dementia without behavioral disturbance: Secondary | ICD-10-CM | POA: Diagnosis not present

## 2021-09-06 DIAGNOSIS — Z452 Encounter for adjustment and management of vascular access device: Secondary | ICD-10-CM | POA: Diagnosis not present

## 2021-09-06 DIAGNOSIS — R4182 Altered mental status, unspecified: Secondary | ICD-10-CM | POA: Diagnosis not present

## 2021-09-06 DIAGNOSIS — K6389 Other specified diseases of intestine: Secondary | ICD-10-CM | POA: Diagnosis not present

## 2021-09-06 DIAGNOSIS — R6521 Severe sepsis with septic shock: Secondary | ICD-10-CM | POA: Diagnosis not present

## 2021-09-06 DIAGNOSIS — A419 Sepsis, unspecified organism: Secondary | ICD-10-CM | POA: Diagnosis not present

## 2021-09-06 DIAGNOSIS — N134 Hydroureter: Secondary | ICD-10-CM | POA: Diagnosis not present

## 2021-09-06 DIAGNOSIS — N3289 Other specified disorders of bladder: Secondary | ICD-10-CM | POA: Diagnosis not present

## 2021-09-06 DIAGNOSIS — A4152 Sepsis due to Pseudomonas: Secondary | ICD-10-CM | POA: Diagnosis not present

## 2021-09-06 DIAGNOSIS — I10 Essential (primary) hypertension: Secondary | ICD-10-CM | POA: Diagnosis not present

## 2021-09-06 DIAGNOSIS — N200 Calculus of kidney: Secondary | ICD-10-CM | POA: Diagnosis not present

## 2021-09-06 DIAGNOSIS — J9811 Atelectasis: Secondary | ICD-10-CM | POA: Diagnosis not present

## 2021-09-06 DIAGNOSIS — R509 Fever, unspecified: Secondary | ICD-10-CM | POA: Diagnosis not present

## 2021-09-06 DIAGNOSIS — G9341 Metabolic encephalopathy: Secondary | ICD-10-CM | POA: Diagnosis not present

## 2021-09-07 DIAGNOSIS — A419 Sepsis, unspecified organism: Secondary | ICD-10-CM | POA: Diagnosis not present

## 2021-09-07 DIAGNOSIS — A4152 Sepsis due to Pseudomonas: Secondary | ICD-10-CM | POA: Diagnosis not present

## 2021-09-07 DIAGNOSIS — R6521 Severe sepsis with septic shock: Secondary | ICD-10-CM | POA: Diagnosis not present

## 2021-09-07 DIAGNOSIS — G9341 Metabolic encephalopathy: Secondary | ICD-10-CM | POA: Diagnosis not present

## 2021-09-07 DIAGNOSIS — N390 Urinary tract infection, site not specified: Secondary | ICD-10-CM | POA: Diagnosis not present

## 2021-09-08 DIAGNOSIS — R6521 Severe sepsis with septic shock: Secondary | ICD-10-CM | POA: Diagnosis not present

## 2021-09-08 DIAGNOSIS — G9341 Metabolic encephalopathy: Secondary | ICD-10-CM | POA: Diagnosis not present

## 2021-09-08 DIAGNOSIS — A4152 Sepsis due to Pseudomonas: Secondary | ICD-10-CM | POA: Diagnosis not present

## 2021-09-09 DIAGNOSIS — A4152 Sepsis due to Pseudomonas: Secondary | ICD-10-CM | POA: Diagnosis not present

## 2021-09-09 DIAGNOSIS — G9341 Metabolic encephalopathy: Secondary | ICD-10-CM | POA: Diagnosis not present

## 2021-09-09 DIAGNOSIS — R6521 Severe sepsis with septic shock: Secondary | ICD-10-CM | POA: Diagnosis not present

## 2021-09-10 DIAGNOSIS — G9341 Metabolic encephalopathy: Secondary | ICD-10-CM | POA: Diagnosis not present

## 2021-09-10 DIAGNOSIS — R6521 Severe sepsis with septic shock: Secondary | ICD-10-CM | POA: Diagnosis not present

## 2021-09-10 DIAGNOSIS — A419 Sepsis, unspecified organism: Secondary | ICD-10-CM | POA: Diagnosis not present

## 2021-09-10 DIAGNOSIS — N390 Urinary tract infection, site not specified: Secondary | ICD-10-CM | POA: Diagnosis not present

## 2021-09-10 DIAGNOSIS — A4152 Sepsis due to Pseudomonas: Secondary | ICD-10-CM | POA: Diagnosis not present

## 2021-09-11 DIAGNOSIS — G9341 Metabolic encephalopathy: Secondary | ICD-10-CM | POA: Diagnosis not present

## 2021-09-11 DIAGNOSIS — R6521 Severe sepsis with septic shock: Secondary | ICD-10-CM | POA: Diagnosis not present

## 2021-09-11 DIAGNOSIS — A4152 Sepsis due to Pseudomonas: Secondary | ICD-10-CM | POA: Diagnosis not present

## 2021-09-12 DIAGNOSIS — G9341 Metabolic encephalopathy: Secondary | ICD-10-CM | POA: Diagnosis not present

## 2021-09-12 DIAGNOSIS — A4152 Sepsis due to Pseudomonas: Secondary | ICD-10-CM | POA: Diagnosis not present

## 2021-09-12 DIAGNOSIS — R6521 Severe sepsis with septic shock: Secondary | ICD-10-CM | POA: Diagnosis not present

## 2021-09-13 DIAGNOSIS — Z79899 Other long term (current) drug therapy: Secondary | ICD-10-CM | POA: Diagnosis not present

## 2021-09-13 DIAGNOSIS — E119 Type 2 diabetes mellitus without complications: Secondary | ICD-10-CM | POA: Diagnosis not present

## 2021-09-13 DIAGNOSIS — E559 Vitamin D deficiency, unspecified: Secondary | ICD-10-CM | POA: Diagnosis not present

## 2021-09-14 DIAGNOSIS — N390 Urinary tract infection, site not specified: Secondary | ICD-10-CM | POA: Diagnosis not present

## 2021-09-14 DIAGNOSIS — A4152 Sepsis due to Pseudomonas: Secondary | ICD-10-CM | POA: Diagnosis not present

## 2021-09-14 DIAGNOSIS — R2681 Unsteadiness on feet: Secondary | ICD-10-CM | POA: Diagnosis not present

## 2021-09-14 DIAGNOSIS — N320 Bladder-neck obstruction: Secondary | ICD-10-CM | POA: Diagnosis not present

## 2021-10-01 DEATH — deceased
